# Patient Record
Sex: Male | Born: 1955 | Race: White | Hispanic: No | Marital: Single | Smoking: Former smoker
Health system: Southern US, Community
[De-identification: ages and names within clinical notes are randomized; demographics above are authoritative.]

## PROBLEM LIST (undated history)

## (undated) DIAGNOSIS — E785 Hyperlipidemia, unspecified: Secondary | ICD-10-CM

## (undated) DIAGNOSIS — G4730 Sleep apnea, unspecified: Secondary | ICD-10-CM

## (undated) DIAGNOSIS — F32A Depression, unspecified: Secondary | ICD-10-CM

## (undated) DIAGNOSIS — M1990 Unspecified osteoarthritis, unspecified site: Secondary | ICD-10-CM

## (undated) DIAGNOSIS — N189 Chronic kidney disease, unspecified: Secondary | ICD-10-CM

## (undated) DIAGNOSIS — G709 Myoneural disorder, unspecified: Secondary | ICD-10-CM

## (undated) DIAGNOSIS — F329 Major depressive disorder, single episode, unspecified: Secondary | ICD-10-CM

## (undated) DIAGNOSIS — T7840XA Allergy, unspecified, initial encounter: Secondary | ICD-10-CM

## (undated) DIAGNOSIS — K219 Gastro-esophageal reflux disease without esophagitis: Secondary | ICD-10-CM

## (undated) DIAGNOSIS — F419 Anxiety disorder, unspecified: Secondary | ICD-10-CM

## (undated) DIAGNOSIS — J449 Chronic obstructive pulmonary disease, unspecified: Secondary | ICD-10-CM

## (undated) DIAGNOSIS — I10 Essential (primary) hypertension: Secondary | ICD-10-CM

## (undated) DIAGNOSIS — Z5189 Encounter for other specified aftercare: Secondary | ICD-10-CM

## (undated) HISTORY — DX: Gastro-esophageal reflux disease without esophagitis: K21.9

## (undated) HISTORY — DX: Essential (primary) hypertension: I10

## (undated) HISTORY — PX: COLONOSCOPY: SHX174

## (undated) HISTORY — DX: Depression, unspecified: F32.A

## (undated) HISTORY — DX: Major depressive disorder, single episode, unspecified: F32.9

## (undated) HISTORY — DX: Unspecified osteoarthritis, unspecified site: M19.90

## (undated) HISTORY — DX: Encounter for other specified aftercare: Z51.89

## (undated) HISTORY — DX: Chronic obstructive pulmonary disease, unspecified: J44.9

## (undated) HISTORY — DX: Chronic kidney disease, unspecified: N18.9

## (undated) HISTORY — PX: VASECTOMY: SHX75

## (undated) HISTORY — DX: Allergy, unspecified, initial encounter: T78.40XA

## (undated) HISTORY — PX: KNEE SURGERY: SHX244

## (undated) HISTORY — DX: Hyperlipidemia, unspecified: E78.5

## (undated) HISTORY — DX: Anxiety disorder, unspecified: F41.9

## (undated) HISTORY — DX: Myoneural disorder, unspecified: G70.9

## (undated) HISTORY — DX: Sleep apnea, unspecified: G47.30

---
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
## 2008-11-16 HISTORY — PX: SHOULDER SURGERY: SHX246

## 2018-01-04 DIAGNOSIS — Z9989 Dependence on other enabling machines and devices: Secondary | ICD-10-CM

## 2018-01-04 DIAGNOSIS — G47419 Narcolepsy without cataplexy: Secondary | ICD-10-CM | POA: Insufficient documentation

## 2018-01-04 DIAGNOSIS — Z87898 Personal history of other specified conditions: Secondary | ICD-10-CM | POA: Insufficient documentation

## 2018-01-04 DIAGNOSIS — Z720 Tobacco use: Secondary | ICD-10-CM | POA: Insufficient documentation

## 2018-01-04 DIAGNOSIS — G609 Hereditary and idiopathic neuropathy, unspecified: Secondary | ICD-10-CM | POA: Insufficient documentation

## 2018-01-04 DIAGNOSIS — G500 Trigeminal neuralgia: Secondary | ICD-10-CM | POA: Insufficient documentation

## 2018-01-04 DIAGNOSIS — I10 Essential (primary) hypertension: Secondary | ICD-10-CM | POA: Insufficient documentation

## 2018-01-04 DIAGNOSIS — K219 Gastro-esophageal reflux disease without esophagitis: Secondary | ICD-10-CM | POA: Insufficient documentation

## 2018-01-04 DIAGNOSIS — J449 Chronic obstructive pulmonary disease, unspecified: Secondary | ICD-10-CM | POA: Insufficient documentation

## 2018-01-04 DIAGNOSIS — G4733 Obstructive sleep apnea (adult) (pediatric): Secondary | ICD-10-CM | POA: Insufficient documentation

## 2018-01-10 DIAGNOSIS — M546 Pain in thoracic spine: Secondary | ICD-10-CM

## 2018-01-10 DIAGNOSIS — Z8619 Personal history of other infectious and parasitic diseases: Secondary | ICD-10-CM | POA: Insufficient documentation

## 2018-01-10 DIAGNOSIS — G8929 Other chronic pain: Secondary | ICD-10-CM | POA: Insufficient documentation

## 2018-01-10 DIAGNOSIS — M48061 Spinal stenosis, lumbar region without neurogenic claudication: Secondary | ICD-10-CM | POA: Insufficient documentation

## 2018-01-20 DIAGNOSIS — G250 Essential tremor: Secondary | ICD-10-CM | POA: Insufficient documentation

## 2018-01-20 DIAGNOSIS — R2689 Other abnormalities of gait and mobility: Secondary | ICD-10-CM | POA: Insufficient documentation

## 2018-01-26 DIAGNOSIS — M542 Cervicalgia: Secondary | ICD-10-CM | POA: Insufficient documentation

## 2018-02-01 DIAGNOSIS — M25522 Pain in left elbow: Secondary | ICD-10-CM | POA: Insufficient documentation

## 2018-02-02 ENCOUNTER — Ambulatory Visit: Payer: Medicare HMO | Attending: Nurse Practitioner | Admitting: Nurse Practitioner

## 2018-02-02 ENCOUNTER — Encounter: Payer: Self-pay | Admitting: Nurse Practitioner

## 2018-02-02 ENCOUNTER — Encounter (INDEPENDENT_AMBULATORY_CARE_PROVIDER_SITE_OTHER): Payer: Self-pay

## 2018-02-02 VITALS — BP 178/119 | HR 71 | Temp 98.5°F | Resp 16 | Ht 74.0 in | Wt 280.0 lb

## 2018-02-02 DIAGNOSIS — M25522 Pain in left elbow: Secondary | ICD-10-CM | POA: Diagnosis not present

## 2018-02-02 DIAGNOSIS — Z79899 Other long term (current) drug therapy: Secondary | ICD-10-CM | POA: Diagnosis not present

## 2018-02-02 DIAGNOSIS — M899 Disorder of bone, unspecified: Secondary | ICD-10-CM | POA: Diagnosis not present

## 2018-02-02 DIAGNOSIS — M25512 Pain in left shoulder: Secondary | ICD-10-CM | POA: Diagnosis not present

## 2018-02-02 DIAGNOSIS — M79602 Pain in left arm: Secondary | ICD-10-CM | POA: Insufficient documentation

## 2018-02-02 DIAGNOSIS — M25562 Pain in left knee: Secondary | ICD-10-CM | POA: Diagnosis not present

## 2018-02-02 DIAGNOSIS — M546 Pain in thoracic spine: Secondary | ICD-10-CM | POA: Insufficient documentation

## 2018-02-02 DIAGNOSIS — M25511 Pain in right shoulder: Secondary | ICD-10-CM | POA: Diagnosis not present

## 2018-02-02 DIAGNOSIS — G894 Chronic pain syndrome: Secondary | ICD-10-CM | POA: Diagnosis not present

## 2018-02-02 DIAGNOSIS — Z9889 Other specified postprocedural states: Secondary | ICD-10-CM | POA: Insufficient documentation

## 2018-02-02 DIAGNOSIS — M5442 Lumbago with sciatica, left side: Secondary | ICD-10-CM | POA: Diagnosis not present

## 2018-02-02 DIAGNOSIS — G8929 Other chronic pain: Secondary | ICD-10-CM | POA: Insufficient documentation

## 2018-02-02 DIAGNOSIS — M542 Cervicalgia: Secondary | ICD-10-CM | POA: Insufficient documentation

## 2018-02-02 DIAGNOSIS — M545 Low back pain, unspecified: Secondary | ICD-10-CM | POA: Insufficient documentation

## 2018-02-02 DIAGNOSIS — M25552 Pain in left hip: Secondary | ICD-10-CM | POA: Diagnosis not present

## 2018-02-02 DIAGNOSIS — M5441 Lumbago with sciatica, right side: Secondary | ICD-10-CM | POA: Insufficient documentation

## 2018-02-02 DIAGNOSIS — M25561 Pain in right knee: Secondary | ICD-10-CM | POA: Diagnosis not present

## 2018-02-02 DIAGNOSIS — F17290 Nicotine dependence, other tobacco product, uncomplicated: Secondary | ICD-10-CM | POA: Diagnosis not present

## 2018-02-02 DIAGNOSIS — Z789 Other specified health status: Secondary | ICD-10-CM

## 2018-02-02 DIAGNOSIS — R1031 Right lower quadrant pain: Secondary | ICD-10-CM | POA: Diagnosis not present

## 2018-02-02 NOTE — Progress Notes (Signed)
Safety precautions to be maintained throughout the outpatient stay will include: orient to surroundings, keep bed in low position, maintain call bell within reach at all times, provide assistance with transfer out of bed and ambulation.   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  States that he has had mutliple concussions which is why he is so unsteady on his feet.

## 2018-02-02 NOTE — Progress Notes (Signed)
Patient's Name: Jesse Cortez  MRN: [id number]  Referring Provider: Anabel Bene, MD  DOB: Sep 20, 1956  PCP: No primary care provider on file.  DOS: 02/02/2018  Note by: Dionisio David NP  Service setting: Ambulatory outpatient  Specialty: Interventional Pain Management  Location: [facility] (AMB) Pain Management Facility    Patient type: New Patient    Primary Reason(s) for Visit: Initial Patient Evaluation CC: Back Pain (mid thoracic and lumbar); Neck Pain; Shoulder Pain (bilateral); and Knee Pain (bialteral)  HPI  Mr. Broyhill is a 62 y.o. year old, male patient, who comes today for an initial evaluation. He has Benign essential tremor; Chronic bilateral thoracic back pain; COPD (chronic obstructive pulmonary disease) with chronic bronchitis ([place]); Essential hypertension; Gastroesophageal reflux disease without esophagitis; H/O brain tumor; H/O sepsis; Idiopathic peripheral neuropathy; Left elbow pain; Loss of balance; Narcolepsy; Neck pain; OSA on CPAP; Spinal stenosis of lumbar region; Tobacco user; Trigeminal neuralgia; Chronic thoracic spine pain (Primary Area of Pain); Chronic neck pain (Secondary Area of Pain) (R>L); Chronic pain of left upper extremity; Chronic elbow pain, left [facility] Area of Pain); Chronic pain of both shoulders (Fourth Area of Pain) (L>R); Chronic pain of both knees (L>R); Chronic hip pain, left; Chronic groin pain, right; Chronic bilateral low back pain with bilateral sciatica (L>R); Chronic pain syndrome; Pharmacologic therapy; Disorder of skeletal system; and Problems influencing health status on their problem list.. His primarily concern today is the Back Pain (mid thoracic and lumbar); Neck Pain; Shoulder Pain (bilateral); and Knee Pain (bialteral)  Pain Assessment: Location: Mid(see visit information) Back(see visit information for pain sites ) Radiating:   Onset:   Duration: Chronic pain Quality:   Severity: 8 (back is an 8 and all other pain sites are 5)/10  (self-reported pain score)  Note: Reported level is compatible with observation. Clinically the patient looks like a 2/10 A 2/10 is viewed as "Mild to Moderate" and described as noticeable and distracting. Impossible to hide from other people. More frequent flare-ups. Still possible to adapt and function close to normal. It can be very annoying and may have occasional stronger flare-ups. With discipline, patients may get used to it and adapt. Information on the proper use of the pain scale provided to the patient today. When using our objective Pain Scale, levels between 6 and 10/10 are said to belong in an emergency room, as it progressively worsens from a 6/10, described as severely limiting, requiring emergency care not usually available at an outpatient pain management facility. At a 6/10 level, communication becomes difficult and requires great effort. Assistance to reach the emergency department may be required. Facial flushing and profuse sweating along with potentially dangerous increases in heart rate and blood pressure will be evident. Effect on ADL:   Timing:   Modifying factors:    Onset and Duration: Present longer than 3 months Cause of pain: Work related accident or event Severity: Getting worse, NAS-11 at its worse: 10/10, NAS-11 at its best: 4/10, NAS-11 now: 7/10 and NAS-11 on the average: 7/10 Timing: Not influenced by the time of the day, During activity or exercise and After activity or exercise Aggravating Factors: Bending, Climbing, Intercourse (sex), Kneeling, Lifiting, Motion, Prolonged sitting, Prolonged standing, Squatting, Stooping , Twisting, Walking, Walking uphill, Walking downhill and Working Alleviating Factors: Lying down and Nerve blocks Associated Problems: Depression, Dizziness, Erectile dysfunction, Fatigue, Impotence, Inability to concentrate, Inability to control bladder (urine), Numbness, Personality changes, Sadness, Spasms, Sweating, Tingling, Weakness, Pain  that wakes patient up and Pain that  does not allow patient to sleep Quality of Pain: Aching, Annoying, Cramping, Deep, Disabling, Exhausting, Feeling of constriction, Horrible, Sharp, Shooting, Stabbing, Tender, Tingling and Uncomfortable Previous Examinations or Tests: CT scan, EMG/PNCV, MRI scan, Nerve block, Spinal tap, X-rays, Nerve conduction test and Orthopedic evaluation Previous Treatments: Physical Therapy  The patient comes into the clinics today for the first time for a chronic pain management evaluation. 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 He has had injections by Dr. Redmond Pulling in [place] last 07/2017.he admits the injections were effective and lasted approx 3-4 months. He denies any physical therapy. He did have a recent MRI December 2018 in [place].  His second area of pain is in his neck. He admits that some of the right.he admits that the pain does radiate down into the shoulder up into the head. He admits that he does have trigeminal neuralgia and has been in follow-up by neurology Dr. Melrose Nakayama. He denies any previous surgery, interventional therapy. He has had recent brain MRI, no cervical images.  His third area of pain is in his left elbow. He admits that this is related to a fall. He admits that he has had injections in his elbow in the past however it was not effective He admits that he has been told that he had torn tendon in the elbow.  His fourth area of pain is in his shoulders He admits that the left is greater than the right. He is status post right arthroscopic surgery in the right shoulder which was not effective. He has also had interventional therapy and physical therapy which was not effective.  His fifth area of pain is in his knee. He admits that the left is greater than the right. He has had left knee arthroscopic surgery 2. He  denies any interventional therapy or recent images.  His next area of pain is in his hips. He admits the left is greater than the right. He was told that he had spurs and bursitis to his left hip. He does have pain that radiates to his right inguinal area down to the right thigh. He denies any interventional therapy or physical therapy.  His last area of pain is in his lower back. He denies any previous surgeries, interventional therapy, physical therapy or recent images. He admits that he has been told that he has stenosis in this lumbar spine.  Today I took the time to provide the patient with information regarding this pain practice. The patient was informed that the practice is divided into two sections: an interventional pain management section, as well as a completely separate and distinct medication management section. I explained that there are procedure days for interventional therapies, and evaluation days for follow-ups and medication management. Because of the amount of documentation required during both, they are kept separated. This means that there is the possibility that he may be scheduled for a procedure on one day, and medication management the next. I have also informed him that because of staffing and facility limitations, this practice will no longer take patients for medication management only. To illustrate the reasons for this, I gave the patient the example of surgeons, and how inappropriate it would be to refer a patient to his/her care, just to write for the post-surgical antibiotics on a surgery done by a different surgeon.   Because interventional  pain management is part of the board-certified specialty for the doctors, the patient was informed that joining this practice means that they are open to any and all interventional therapies. I made it clear that this does not mean that they will be forced to have any procedures done. What this means is that I believe interventional  therapies to be essential part of the diagnosis and proper management of chronic pain conditions. Therefore, patients not interested in these interventional alternatives will be better served under the care of a different practitioner.  The patient was also made aware of my Comprehensive Pain Management Safety Guidelines where by joining this practice, they limit all of their nerve blocks and joint injections to those done by our practice, for as long as we are retained to manage their care. Historic Controlled Substance Pharmacotherapy Review  PMP and historical list of controlled substances: Modafinil 200 mg, Lyrica 225 mg,total/acetaminophen 5/325 mg,estosterone 1000 mg,AndroGel 1.62 mg,diazepam 10 mg, hydrocodone/acetaminophen 7.5/325 mg Highest opioid analgesic regimen found: hydrocodone/acetaminophen 7.5/325 mg 4 times daily (last filled 04/15/2016) hydrocodone 30 mg per day Most recent opioid analgesic: hydrocodone/acetaminophen 5/325 mg twice daily (fill date 10/22/2017) hydrocodone 10 mg per day Current opioid analgesics: None Highest recorded MME/day: 30 mg/day MME/day: 0 mg/day Medications: The patient did not bring the medication(s) to the appointment, as requested in our "New Patient Package" Pharmacodynamics: Desired effects: Analgesia: The patient reports >50% benefit. Reported improvement in function: The patient reports medication allows him to accomplish basic ADLs. Clinically meaningful improvement in function (CMIF): Sustained CMIF goals met Perceived effectiveness: Described as relatively effective, allowing for increase in activities of daily living (ADL) Undesirable effects: Side-effects or Adverse reactions: None reported Historical Monitoring: The patient  reports that he does not use drugs. List of all UDS Test(s): No results found for: MDMA, COCAINSCRNUR, PCPSCRNUR, PCPQUANT, CANNABQUANT, THCU, [place] List of all Serum Drug Screening Test(s):  No results found for:  AMPHSCRSER, BARBSCRSER, BENZOSCRSER, COCAINSCRSER, PCPSCRSER, PCPQUANT, THCSCRSER, CANNABQUANT, OPIATESCRSER, OXYSCRSER, PROPOXSCRSER Historical Background Evaluation: [place] PDMP: Six (6) year initial data search conducted.             [place] Department of public safety, offender search: Editor, commissioning Information) Non-contributory Risk Assessment Profile: Aberrant behavior: None observed or detected today Risk factors for fatal opioid overdose: caucasian and male gender Fatal overdose hazard ratio (HR): Calculation deferred Non-fatal overdose hazard ratio (HR): Calculation deferred Risk of opioid abuse or dependence: 0.7-3.0% with doses ? 36 MME/day and 6.1-26% with doses ? 120 MME/day. Substance use disorder (SUD) risk level: Pending results of Medical Psychology Evaluation for SUD Opioid risk tool (ORT) (Total Score): 6  ORT Scoring interpretation table:  Score <3 = Low Risk for SUD  Score between 4-7 = Moderate Risk for SUD  Score >8 = High Risk for Opioid Abuse   PHQ-2 Depression Scale:  Total score: 0  PHQ-2 Scoring interpretation table: (Score and probability of major depressive disorder)  Score 0 = No depression  Score 1 = 15.4% Probability  Score 2 = 21.1% Probability  Score 3 = 38.4% Probability  Score 4 = 45.5% Probability  Score 5 = 56.4% Probability  Score 6 = 78.6% Probability   PHQ-9 Depression Scale:  Total score: 0  PHQ-9 Scoring interpretation table:  Score 0-4 = No depression  Score 5-9 = Mild depression  Score 10-14 = Moderate depression  Score 15-19 = Moderately severe depression  Score 20-27 = Severe depression (2.4 times higher risk of SUD and 2.89 times higher risk  of overuse)   Pharmacologic Plan: Pending ordered tests and/or consults  Meds  The patient has a current medication list which includes the following prescription(s): albuterol, carbamazepine, fluticasone furoate-vilanterol, hydrocodone-acetaminophen, nifedipine, omeprazole, pregabalin, propranolol er,  tamsulosin, umeclidinium bromide, valsartan, and venlafaxine.  Current Outpatient Medications on File Prior to Visit  Medication Sig  . albuterol (PROAIR HFA) 108 (90 Base) MCG/ACT inhaler Inhale 2 puffs into the lungs every 6 (six) hours as needed.  . carbamazepine (TEGRETOL XR) 200 MG 12 hr tablet Take 200 mg by mouth 2 (two) times daily.  . fluticasone furoate-vilanterol (BREO ELLIPTA) 200-25 MCG/INH AEPB Inhale 1 puff into the lungs daily.  Marland Kitchen HYDROcodone-acetaminophen (NORCO/VICODIN) 5-325 MG tablet Take 1 tablet by mouth every 6 (six) hours as needed.  Marland Kitchen NIFEdipine (PROCARDIA-XL/ADALAT CC) 60 MG 24 hr tablet Take 60 mg by mouth daily.  Marland Kitchen omeprazole (PRILOSEC) 20 MG capsule Take 20 mg by mouth daily.  . pregabalin (LYRICA) 225 MG capsule Take 225 mg by mouth 2 (two) times daily.  . propranolol ER (INDERAL LA) 120 MG 24 hr capsule Take 120 mg by mouth 2 (two) times daily.  . tamsulosin (FLOMAX) 0.4 MG CAPS capsule Take 0.4 mg by mouth daily.  Marland Kitchen umeclidinium bromide (INCRUSE ELLIPTA) 62.5 MCG/INH AEPB Inhale 62.5 Inhalers into the lungs daily.  . valsartan (DIOVAN) 160 MG tablet Take 160 mg by mouth daily.  Marland Kitchen venlafaxine (EFFEXOR) 100 MG tablet Take 100 mg by mouth 2 (two) times daily.   No current facility-administered medications on file prior to visit.    Imaging Review  Note: Available results from prior imaging studies were reviewed.        ROS  Cardiovascular History: High blood pressure Pulmonary or Respiratory History: Wheezing and difficulty taking a deep full breath (Asthma), Smoking, Snoring  and Temporary stoppage of breathing during sleep Neurological History: Abnormal skin sensations (Peripheral Neuropathy) Review of Past Neurological Studies: No results found for this or any previous visit. Psychological-Psychiatric History: Anxiousness, Depressed and Prone to panicking Gastrointestinal History: No reported gastrointestinal signs or symptoms such as vomiting or evacuating  blood, reflux, heartburn, alternating episodes of diarrhea and constipation, inflamed or scarred liver, or pancreas or irrregular and/or infrequent bowel movements Genitourinary History: Passing kidney stones Hematological History: No reported hematological signs or symptoms such as prolonged bleeding, low or poor functioning platelets, bruising or bleeding easily, hereditary bleeding problems, low energy levels due to low hemoglobin or being anemic Endocrine History: No reported endocrine signs or symptoms such as high or low blood sugar, rapid heart rate due to high thyroid levels, obesity or weight gain due to slow thyroid or thyroid disease Rheumatologic History: Generalized muscle aches (Fibromyalgia) Musculoskeletal History: Negative for myasthenia gravis, muscular dystrophy, multiple sclerosis or malignant hyperthermia Work History: Disabled  Allergies  Mr. Mitton is allergic to penicillin g and primidone.  Laboratory Chemistry  Inflammation Markers No results found for: CRP, ESRSEDRATE (CRP: Acute Phase) (ESR: Chronic Phase) Renal Function Markers No results found for: BUN, CREATININE, GFRAA, GFRNONAA Hepatic Function Markers No results found for: AST, ALT, ALBUMIN, ALKPHOS, HCVAB Electrolytes No results found for: NA, K, CL, CALCIUM, MG Neuropathy Markers No results found for: [id number] Bone Pathology Markers No results found for: Hendricks Milo, VD125OH2TOT, [id number], [id number], 25OHVITD1, 25OHVITD2, 25OHVITD3, CALCIUM, TESTOFREE, TESTOSTERONE Coagulation Parameters No results found for: INR, LABPROT, APTT, PLT Cardiovascular Markers No results found for: BNP, HGB, HCT Note: Lab results reviewed.  [place]  Drug: Mr. Wenzler  reports that he does not use  drugs. Alcohol:  reports that he does not drink alcohol. Tobacco:  reports that he has been smoking cigars.  he has never used smokeless tobacco. Medical:  has a past medical history of Anxiety, Blood transfusion without  reported diagnosis (1968), Chronic kidney disease, COPD (chronic obstructive pulmonary disease) ([place]), Depression, GERD (gastroesophageal reflux disease), Hyperlipidemia, Hypertension, Neuromuscular disorder ([place]), and Sleep apnea. Family: family history includes COPD in his mother; Dementia in his mother; Diabetes in his father and mother; Heart disease in his mother.  Past Surgical History:  Procedure Laterality Date  . 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 KNEE SURGERY Left 1987, 2013  . SHOULDER SURGERY Right 2010   orthoscopy  . TONSILLECTOMY  1964  . VASECTOMY     Active Ambulatory Problems    Diagnosis Date Noted  . Benign essential tremor 01/20/2018  . Chronic bilateral thoracic back pain 01/10/2018  . COPD (chronic obstructive pulmonary disease) with chronic bronchitis ([place]) 01/04/2018  . Essential hypertension 01/04/2018  . Gastroesophageal reflux disease without esophagitis 01/04/2018  . H/O brain tumor 01/04/2018  . H/O sepsis 01/10/2018  . Idiopathic peripheral neuropathy 01/04/2018  . Left elbow pain 02/01/2018  . Loss of balance 01/20/2018  . Narcolepsy 01/04/2018  . Neck pain 01/26/2018  . OSA on CPAP 01/04/2018  . Spinal stenosis of lumbar region 01/10/2018  . Tobacco user 01/04/2018  . Trigeminal neuralgia 01/04/2018  . Chronic thoracic spine pain (Primary Area of Pain) 02/02/2018  . Chronic neck pain (Secondary Area of Pain) (R>L) 02/02/2018  . Chronic pain of left upper extremity 02/02/2018  . Chronic elbow pain, left [facility] Area of Pain) 02/02/2018  . Chronic pain of both shoulders (Fourth Area of Pain) (L>R) 02/02/2018  . Chronic pain of both knees (L>R) 02/02/2018  . Chronic hip pain, left 02/02/2018  . Chronic groin pain, right 02/02/2018  . Chronic bilateral low back pain with bilateral sciatica (L>R) 02/02/2018  . Chronic pain syndrome 02/02/2018  . Pharmacologic therapy 02/02/2018  . Disorder of skeletal system 02/02/2018  . Problems  influencing health status 02/02/2018   Resolved Ambulatory Problems    Diagnosis Date Noted  . No Resolved Ambulatory Problems   Past Medical History:  Diagnosis Date  . Anxiety   . Blood transfusion without reported diagnosis 1968  . Chronic kidney disease   . COPD (chronic obstructive pulmonary disease) ([place])   . Depression   . GERD (gastroesophageal reflux disease)   . Hyperlipidemia   . Hypertension   . Neuromuscular disorder ([place])   . Sleep apnea    Constitutional Exam  General appearance: Well nourished, well developed, and well hydrated. In no apparent acute distress Vitals:   02/02/18 1308  BP: (!) 178/119  Pulse: 71  Resp: 16  Temp: 98.5 F (36.9 C)  TempSrc: Oral  SpO2: 99%  Weight: 280 lb (127 kg)  Height: '6\' 2"'  (1.88 m)   BMI Assessment: Estimated body mass index is 35.95 kg/m as calculated from the following:   Height as of this encounter: '6\' 2"'  (1.88 m).   Weight as of this encounter: 280 lb (127 kg).  BMI interpretation table: BMI level Category Range association with higher incidence of chronic pain  <18 kg/m2 Underweight   18.5-24.9 kg/m2 Ideal body weight   25-29.9 kg/m2 Overweight Increased incidence by 20%  30-34.9 kg/m2 Obese (Class I) Increased incidence by 68%  35-39.9 kg/m2 Severe obesity (Class II) Increased incidence by 136%  >40 kg/m2 Extreme obesity (Class  III) Increased incidence by 254%   BMI Readings from Last 4 Encounters:  02/02/18 35.95 kg/m   Wt Readings from Last 4 Encounters:  02/02/18 280 lb (127 kg)  Psych/Mental status: Alert, oriented x 3 (person, place, & time)       Eyes: PERLA Respiratory: No evidence of acute respiratory distress  Cervical Spine Exam  Inspection: No masses, redness, or swelling Alignment: Symmetrical Functional ROM: Unrestricted ROM      Stability: No instability detected Muscle strength & Tone: Functionally intact Sensory: Unimpaired Palpation: Complains of area being tender to palpation               Upper Extremity (UE) Exam    Side: Right upper extremity  Side: Left upper extremity  Inspection: No masses, redness, swelling, or asymmetry. No contractures  Inspection: No masses, redness, swelling, or asymmetry. No contractures  Functional ROM: Adequate ROM          Functional ROM: Adequate ROM          Muscle strength & Tone: Functionally intact  Muscle strength & Tone: Functionally intact  Sensory: Unimpaired  Sensory: Unimpaired  Palpation: No palpable anomalies              Palpation: No palpable anomalies              Specialized Test(s): Deferred         Specialized Test(s): Deferred          Thoracic Spine Exam  Inspection: No masses, redness, or swelling Alignment: Symmetrical Functional ROM: Unrestricted ROM Stability: No instability detected Sensory: Unimpaired Muscle strength & Tone: Complains of area being tender to palpation  Lumbar Spine Exam  Inspection: No masses, redness, or swelling Alignment: Symmetrical Functional ROM: Unrestricted ROM      Stability: No instability detected Muscle strength & Tone: Functionally intact Sensory: Unimpaired Palpation: No palpable anomalies       Provocative Tests: Lumbar Hyperextension and rotation test: Unable to perform       Patrick's Maneuver: Positive for left-sided S-I arthralgia and for left hip arthralgia  Gait & Posture Assessment  Ambulation: Patient ambulates using a cane Gait: Awkward Posture: WNL   Lower Extremity Exam    Side: Right lower extremity  Side: Left lower extremity  Inspection: No masses, redness, swelling, or asymmetry. No contractures  Inspection: No masses, redness, swelling, or asymmetry. No contractures  Functional ROM: Adequate ROM          Functional ROM: Painful ROM for hip joint  Muscle strength & Tone: Functionally intact  Muscle strength & Tone: Functionally intact  Sensory: Unimpaired  Sensory: Unimpaired  Palpation: Complains of area being tender to palpation  Palpation:  Complains of area being tender to palpation   Assessment  Primary Diagnosis & Pertinent Problem List: The primary encounter diagnosis was Chronic thoracic spine pain. Diagnoses of Chronic neck pain (Secondary Area of Pain) (R>L), Chronic pain of left upper extremity, Chronic elbow pain, left (Tertiary Area of Pain), Chronic pain of both shoulders (Fourth Area of Pain) (L>R), Chronic pain of both knees (L>R), Chronic hip pain, left, Chronic groin pain, right, Chronic bilateral low back pain with bilateral sciatica (L>R), Chronic pain syndrome, Pharmacologic therapy, Disorder of skeletal system, and Problems influencing health status were also pertinent to this visit.  Visit Diagnosis: 1. Chronic thoracic spine pain   2. Chronic neck pain (Secondary Area of Pain) (R>L)   3. Chronic pain of left upper extremity   4. Chronic elbow  pain, left [facility] Area of Pain)   5. Chronic pain of both shoulders (Fourth Area of Pain) (L>R)   6. Chronic pain of both knees (L>R)   7. Chronic hip pain, left   8. Chronic groin pain, right   9. Chronic bilateral low back pain with bilateral sciatica (L>R)   10. Chronic pain syndrome   11. Pharmacologic therapy   12. Disorder of skeletal system   13. Problems influencing health status    Plan of Care  Initial treatment plan:  Please be advised that as per protocol, today's visit has been an evaluation only. We have not taken over the patient's controlled substance management.  Problem-specific plan: No problem-specific Assessment & Plan notes found for this encounter.  Ordered Lab-work, Procedure(s), Referral(s), & Consult(s): Orders Placed This Encounter  Procedures  . DG Cervical Spine With Flex & Extend  . DG Lumbar Spine Complete W/Bend  . DG ELBOW COMPLETE LEFT (3+VIEW)  . DG Shoulder Right  . DG Shoulder Left  . DG Knee 1-2 Views Left  . DG Knee 1-2 Views Right  . DG HIP UNILAT W OR W/O PELVIS 2-3 VIEWS LEFT  . DG Si Joints  . Compliance Drug  Analysis, Ur  . Comp. Metabolic Panel (12)  . Magnesium  . Vitamin B12  . Sedimentation rate  . 25-Hydroxyvitamin D Lcms D2+D3  . C-reactive protein  . Ambulatory referral to Psychology   Pharmacotherapy: Medications ordered:  No orders of the defined types were placed in this encounter.  Medications administered during this visit: Kewan Mcnease had no medications administered during this visit.   Pharmacotherapy under consideration:  Opioid Analgesics: The patient was informed that there is no guarantee that he would be a candidate for opioid analgesics. The decision will be made following CDC guidelines. This decision will be based on the results of diagnostic studies, as well as Mr. Springborn risk profile.  Membrane stabilizer: To be determined at a later time Muscle relaxant: To be determined at a later time NSAID: To be determined at a later time Other analgesic(s): To be determined at a later time   Interventional therapies under consideration: Mr. Seres was informed that there is no guarantee that he would be a candidate for interventional therapies. The decision will be based on the results of diagnostic studies, as well as Mr. Soberano risk profile.  Possible procedure(s): Diagnostic midline thoracic facet nerve block possible midline thoracic facet RFA Diagnostic right cervical facet nerve block Possible right cervical facet RFA Diagnostic occipital nerve block diagnostic bilateral shoulder intraarticular joint injection Diagnostic bilateral intra-articular knee injections Hyalgan series Diagnostic bilateral genicular nerve block Possible bilateral genicular nerve RFA Diagnostic left hip intra-articular joint injection Diagnostic bilateral lumbar transforaminal ESI diagnostic bilateral lumbar facet nerve block Possible bilateral lumbar facet RFA   Provider-requested follow-up: Return for 2nd Visit, w/ Dr. Dossie Arbour, after [facility] eval.  No future  appointments.  Primary Care Physician: No primary care provider on file. Location: [facility] Note by:  Date: 02/02/2018; Time: 3:23 PM  Pain Score Disclaimer: We use the NRS-11 scale. This is a self-reported, subjective measurement of pain severity with only modest accuracy. It is used primarily to identify changes within a particular patient. It must be understood that outpatient pain scales are significantly less accurate that those used for research, where they can be applied under ideal controlled circumstances with minimal exposure to variables. In reality, the score is likely to be a combination of pain intensity  and pain affect, where pain affect describes the degree of emotional arousal or changes in action readiness caused by the sensory experience of pain. Factors such as social and work situation, setting, emotional state, anxiety levels, expectation, and prior pain experience may influence pain perception and show large inter-individual differences that may also be affected by time variables.  Patient instructions provided during this appointment: Patient Instructions   ____________________________________________________________________________________________  Appointment Policy Summary  It is our goal and responsibility to provide the medical community with assistance in the evaluation and management of patients with chronic pain. Unfortunately our resources are limited. Because we do not have an unlimited amount of time, or available appointments, we are required to closely monitor and manage their use. The following rules exist to maximize their use:  Patient's responsibilities: 1. Punctuality:  At what time should I arrive? You should be physically present in our office 30 minutes before your scheduled appointment. Your scheduled appointment is with your assigned healthcare provider. However, it takes 5-10 minutes to be "checked-in", and another 15  minutes for the nurses to do the admission. If you arrive to our office at the time you were given for your appointment, you will end up being at least 20-25 minutes late to your appointment with the provider. 2. Tardiness:  What happens if I arrive only a few minutes after my scheduled appointment time? You will need to reschedule your appointment. The cutoff is your appointment time. This is why it is so important that you arrive at least 30 minutes before that appointment. If you have an appointment scheduled for 10:00 AM and you arrive at 10:01, you will be required to reschedule your appointment.  3. Plan ahead:  Always assume that you will encounter traffic on your way in. Plan for it. If you are dependent on a driver, make sure they understand these rules and the need to arrive early. 4. Other appointments and responsibilities:  Avoid scheduling any other appointments before or after your pain clinic appointments.  5. Be prepared:  Write down everything that you need to discuss with your healthcare provider and give this information to the admitting nurse. Write down the medications that you will need refilled. Bring your pills and bottles (even the empty ones), to all of your appointments, except for those where a procedure is scheduled. 6. No children or pets:  Find someone to take care of them. It is not appropriate to bring them in. 7. Scheduling changes:  We request "advanced notification" of any changes or cancellations. 8. Advanced notification:  Defined as a time period of more than 24 hours prior to the originally scheduled appointment. This allows for the appointment to be offered to other patients. 9. Rescheduling:  When a visit is rescheduled, it will require the cancellation of the original appointment. For this reason they both fall within the category of "Cancellations".  10. Cancellations:  They require advanced notification. Any cancellation less than 24 hours before the   appointment will be recorded as a "No Show". 11. No Show:  Defined as an unkept appointment where the patient failed to notify or declare to the practice their intention or inability to keep the appointment.  Corrective process for repeat offenders:  1. Tardiness: Three (3) episodes of rescheduling due to late arrivals will be recorded as one (1) "No Show". 2. Cancellation or reschedule: Three (3) cancellations or rescheduling will be recorded as one (1) "No Show". 3. "No Shows": Three (3) "No Shows" within a  12 month period will result in discharge from the practice. ____________________________________________________________________________________________  ____________________________________________________________________________________________  Pain Scale  Introduction: The pain score used by this practice is the Verbal Numerical Rating Scale (VNRS-11). This is an 11-point scale. It is for adults and children 10 years or older. There are significant differences in how the pain score is reported, used, and applied. Forget everything you learned in the past and learn this scoring system.  General Information: The scale should reflect your current level of pain. Unless you are specifically asked for the level of your worst pain, or your average pain. If you are asked for one of these two, then it should be understood that it is over the past 24 hours.  Basic Activities of Daily Living (ADL): Personal hygiene, dressing, eating, transferring, and using restroom.  Instructions: Most patients tend to report their level of pain as a combination of two factors, their physical pain and their psychosocial pain. This last one is also known as "suffering" and it is reflection of how physical pain affects you socially and psychologically. From now on, report them separately. From this point on, when asked to report your pain level, report only your physical pain. Use the following table for  reference.  Pain Clinic Pain Levels (0-5/10)  Pain Level Score  Description  No Pain 0   Mild pain 1 Nagging, annoying, but does not interfere with basic activities of daily living (ADL). Patients are able to eat, bathe, get dressed, toileting (being able to get on and off the toilet and perform personal hygiene functions), transfer (move in and out of bed or a chair without assistance), and maintain continence (able to control bladder and bowel functions). Blood pressure and heart rate are unaffected. A normal heart rate for a healthy adult ranges from 60 to 100 bpm (beats per minute).   Mild to moderate pain 2 Noticeable and distracting. Impossible to hide from other people. More frequent flare-ups. Still possible to adapt and function close to normal. It can be very annoying and may have occasional stronger flare-ups. With discipline, patients may get used to it and adapt.   Moderate pain 3 Interferes significantly with activities of daily living (ADL). It becomes difficult to feed, bathe, get dressed, get on and off the toilet or to perform personal hygiene functions. Difficult to get in and out of bed or a chair without assistance. Very distracting. With effort, it can be ignored when deeply involved in activities.   Moderately severe pain 4 Impossible to ignore for more than a few minutes. With effort, patients may still be able to manage work or participate in some social activities. Very difficult to concentrate. Signs of autonomic nervous system discharge are evident: dilated pupils (mydriasis); mild sweating (diaphoresis); sleep interference. Heart rate becomes elevated (>115 bpm). Diastolic blood pressure (lower number) rises above 100 mmHg. Patients find relief in laying down and not moving.   Severe pain 5 Intense and extremely unpleasant. Associated with frowning face and frequent crying. Pain overwhelms the senses.  Ability to do any activity or maintain social relationships becomes  significantly limited. Conversation becomes difficult. Pacing back and forth is common, as getting into a comfortable position is nearly impossible. Pain wakes you up from deep sleep. Physical signs will be obvious: pupillary dilation; increased sweating; goosebumps; brisk reflexes; cold, clammy hands and feet; nausea, vomiting or dry heaves; loss of appetite; significant sleep disturbance with inability to fall asleep or to remain asleep. When persistent, significant weight loss is  observed due to the complete loss of appetite and sleep deprivation.  Blood pressure and heart rate becomes significantly elevated. Caution: If elevated blood pressure triggers a pounding headache associated with blurred vision, then the patient should immediately seek attention at an urgent or emergency care unit, as these may be signs of an impending stroke.    Emergency Department Pain Levels (6-10/10)  Emergency Room Pain 6 Severely limiting. Requires emergency care and should not be seen or managed at an outpatient pain management facility. Communication becomes difficult and requires great effort. Assistance to reach the emergency department may be required. Facial flushing and profuse sweating along with potentially dangerous increases in heart rate and blood pressure will be evident.   Distressing pain 7 Self-care is very difficult. Assistance is required to transport, or use restroom. Assistance to reach the emergency department will be required. Tasks requiring coordination, such as bathing and getting dressed become very difficult.   Disabling pain 8 Self-care is no longer possible. At this level, pain is disabling. The individual is unable to do even the most "basic" activities such as walking, eating, bathing, dressing, transferring to a bed, or toileting. Fine motor skills are lost. It is difficult to think clearly.   Incapacitating pain 9 Pain becomes incapacitating. Thought processing is no longer possible.  Difficult to remember your own name. Control of movement and coordination are lost.   The worst pain imaginable 10 At this level, most patients pass out from pain. When this level is reached, collapse of the autonomic nervous system occurs, leading to a sudden drop in blood pressure and heart rate. This in turn results in a temporary and dramatic drop in blood flow to the brain, leading to a loss of consciousness. Fainting is one of the body's self defense mechanisms. Passing out puts the brain in a calmed state and causes it to shut down for a while, in order to begin the healing process.    Summary: 1. Refer to this scale when providing [place] with your pain level. 2. Be accurate and careful when reporting your pain level. This will help with your care. 3. Over-reporting your pain level will lead to loss of credibility. 4. Even a level of 1/10 means that there is pain and will be treated at our facility. 5. High, inaccurate reporting will be documented as "Symptom Exaggeration", leading to loss of credibility and suspicions of possible secondary gains such as obtaining more narcotics, or wanting to appear disabled, for fraudulent reasons. 6. Only pain levels of 5 or below will be seen at our facility. 7. Pain levels of 6 and above will be sent to the Emergency Department and the appointment cancelled. ____________________________________________________________________________________________

## 2018-02-02 NOTE — Patient Instructions (Signed)

## 2018-02-07 DIAGNOSIS — G5623 Lesion of ulnar nerve, bilateral upper limbs: Secondary | ICD-10-CM | POA: Insufficient documentation

## 2018-02-07 DIAGNOSIS — M75112 Incomplete rotator cuff tear or rupture of left shoulder, not specified as traumatic: Secondary | ICD-10-CM | POA: Insufficient documentation

## 2018-02-07 DIAGNOSIS — N281 Cyst of kidney, acquired: Secondary | ICD-10-CM | POA: Insufficient documentation

## 2018-02-07 DIAGNOSIS — Z9889 Other specified postprocedural states: Secondary | ICD-10-CM | POA: Insufficient documentation

## 2018-02-07 DIAGNOSIS — K7689 Other specified diseases of liver: Secondary | ICD-10-CM | POA: Insufficient documentation

## 2018-02-07 LAB — COMPLIANCE DRUG ANALYSIS, UR

## 2018-02-09 LAB — 25-HYDROXY VITAMIN D LCMS D2+D3: 25-Hydroxy, Vitamin D: 19 ng/mL — ABNORMAL LOW

## 2018-02-09 LAB — COMP. METABOLIC PANEL (12)
ALBUMIN: 4.6 g/dL (ref 3.5–5.5)
ALK PHOS: 105 IU/L (ref 39–117)
AST: 12 IU/L (ref 0–40)
Albumin/Globulin Ratio: 2.2 (ref 1.2–2.2)
BUN/Creatinine Ratio: 15 (ref 9–20)
BUN: 12 mg/dL (ref 6–24)
Bilirubin Total: <0.2 mg/dL (ref 0.0–1.2)
CREATININE: 0.79 mg/dL (ref 0.76–1.27)
Calcium: 8.9 mg/dL (ref 8.7–10.2)
Chloride: 101 mmol/L (ref 96–106)
GFR calc Af Amer: 114 mL/min/{1.73_m2} (ref >59)
GFR, EST NON AFRICAN AMERICAN: 98 mL/min/{1.73_m2} (ref >59)
GLUCOSE: 91 mg/dL (ref 65–99)
Globulin, Total: 2.1 g/dL (ref 1.5–4.5)
POTASSIUM: 4.3 mmol/L (ref 3.5–5.2)
SODIUM: 137 mmol/L (ref 134–144)
Total Protein: 6.7 g/dL (ref 6.0–8.5)

## 2018-02-09 LAB — VITAMIN B12: VITAMIN B 12: 272 pg/mL (ref 232–1245)

## 2018-02-09 LAB — SEDIMENTATION RATE: Sed Rate: 26 mm/hr (ref 0–30)

## 2018-02-09 LAB — MAGNESIUM: MAGNESIUM: 2.2 mg/dL (ref 1.6–2.3)

## 2018-02-09 LAB — C-REACTIVE PROTEIN: CRP: 14.3 mg/L — ABNORMAL HIGH (ref 0.0–4.9)

## 2018-02-09 LAB — 25-HYDROXYVITAMIN D LCMS D2+D3
25-HYDROXY, VITAMIN D-2: 13 ng/mL
25-HYDROXY, VITAMIN D-3: 6.2 ng/mL

## 2018-03-17 DIAGNOSIS — S46212A Strain of muscle, fascia and tendon of other parts of biceps, left arm, initial encounter: Secondary | ICD-10-CM | POA: Insufficient documentation

## 2018-04-19 DIAGNOSIS — N200 Calculus of kidney: Secondary | ICD-10-CM | POA: Insufficient documentation

## 2018-04-27 DIAGNOSIS — M25541 Pain in joints of right hand: Secondary | ICD-10-CM | POA: Insufficient documentation

## 2018-07-01 DIAGNOSIS — R251 Tremor, unspecified: Secondary | ICD-10-CM | POA: Insufficient documentation

## 2018-10-20 DIAGNOSIS — F4310 Post-traumatic stress disorder, unspecified: Secondary | ICD-10-CM | POA: Insufficient documentation

## 2018-12-30 DIAGNOSIS — Z87891 Personal history of nicotine dependence: Secondary | ICD-10-CM | POA: Insufficient documentation

## 2019-09-06 DIAGNOSIS — R7989 Other specified abnormal findings of blood chemistry: Secondary | ICD-10-CM | POA: Insufficient documentation

## 2019-09-06 DIAGNOSIS — E785 Hyperlipidemia, unspecified: Secondary | ICD-10-CM | POA: Insufficient documentation

## 2019-09-06 DIAGNOSIS — K635 Polyp of colon: Secondary | ICD-10-CM | POA: Insufficient documentation

## 2019-10-15 DIAGNOSIS — H538 Other visual disturbances: Secondary | ICD-10-CM | POA: Insufficient documentation

## 2019-12-26 ENCOUNTER — Other Ambulatory Visit: Payer: Self-pay | Admitting: Internal Medicine

## 2019-12-26 DIAGNOSIS — R1011 Right upper quadrant pain: Secondary | ICD-10-CM

## 2019-12-29 ENCOUNTER — Ambulatory Visit
Admission: RE | Admit: 2019-12-29 | Discharge: 2019-12-29 | Disposition: A | Discharge status: Home / Self Care | Payer: Medicare HMO | Source: Ambulatory Visit | Attending: Internal Medicine | Admitting: Internal Medicine

## 2019-12-29 ENCOUNTER — Other Ambulatory Visit: Payer: Self-pay

## 2019-12-29 DIAGNOSIS — R1011 Right upper quadrant pain: Secondary | ICD-10-CM | POA: Insufficient documentation

## 2019-12-29 IMAGING — US US ABDOMEN LIMITED
1 series · 14 of 25 positions shown · non-contrast
Comparison: None.

CLINICAL DATA: 63-year-old male with RIGHT UPPER quadrant abdominal
pain

EXAM:
ULTRASOUND ABDOMEN LIMITED RIGHT UPPER QUADRANT

[Series 1: us abdomen limited · 0.28mm/px · 14 of 39 slices shown]
[im 1/39]
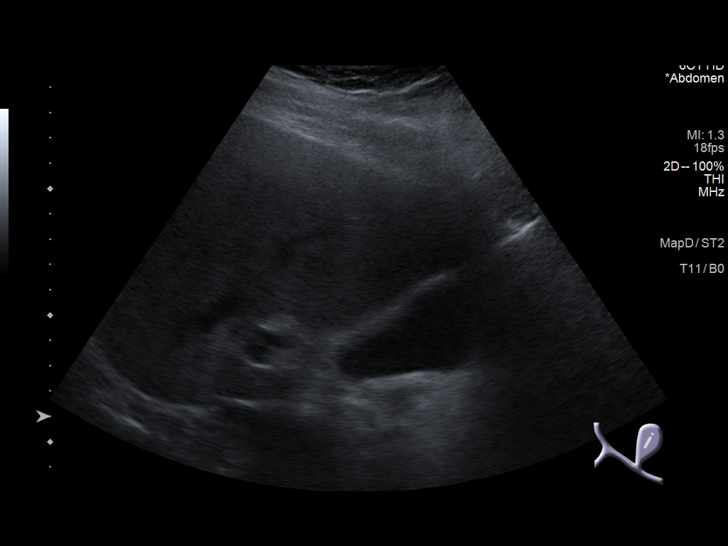
[im 4/39]
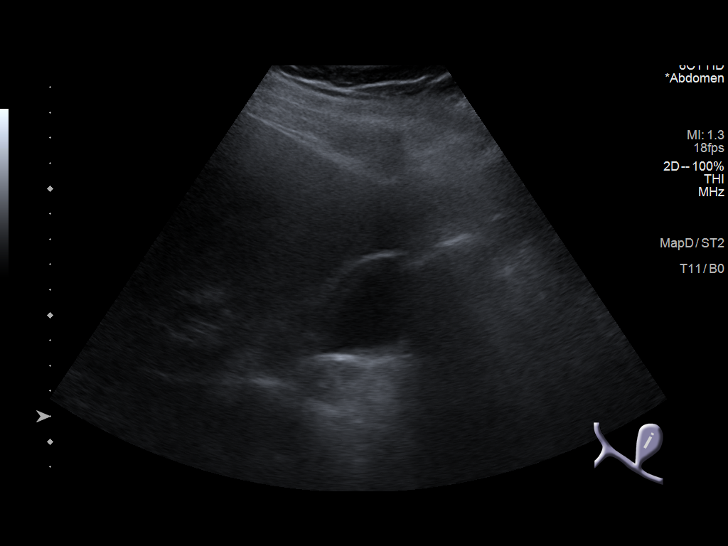
[im 7/39]
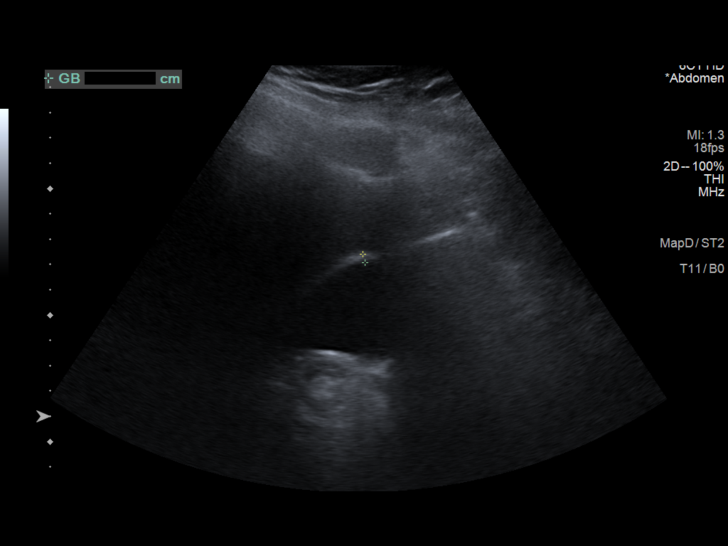
[im 10/39]
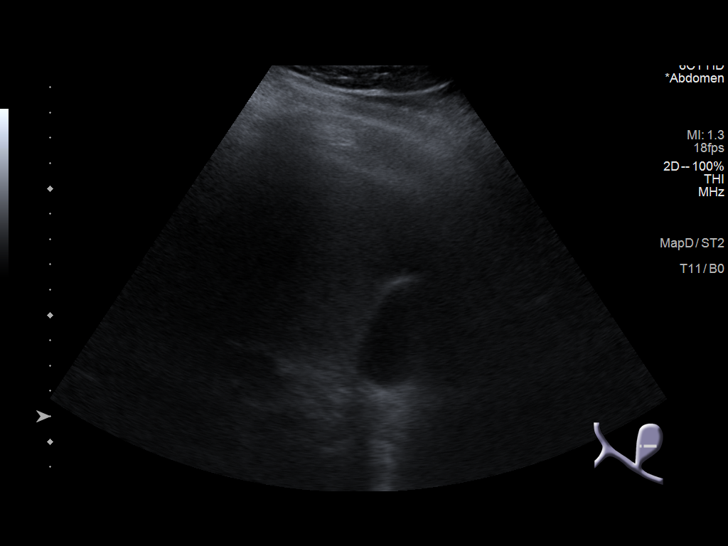
[im 13/39]
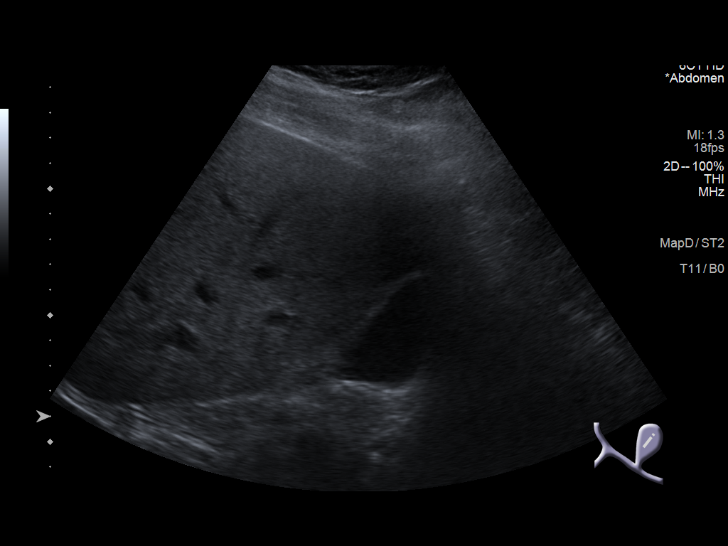
[im 15/39]
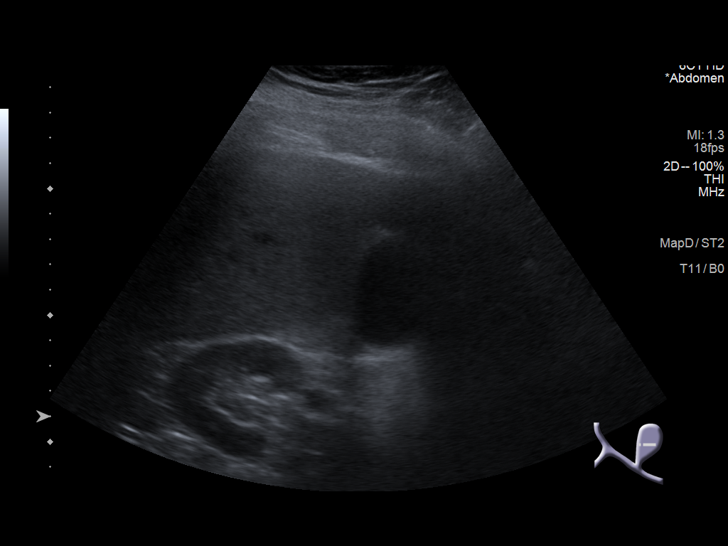
[im 18/39]
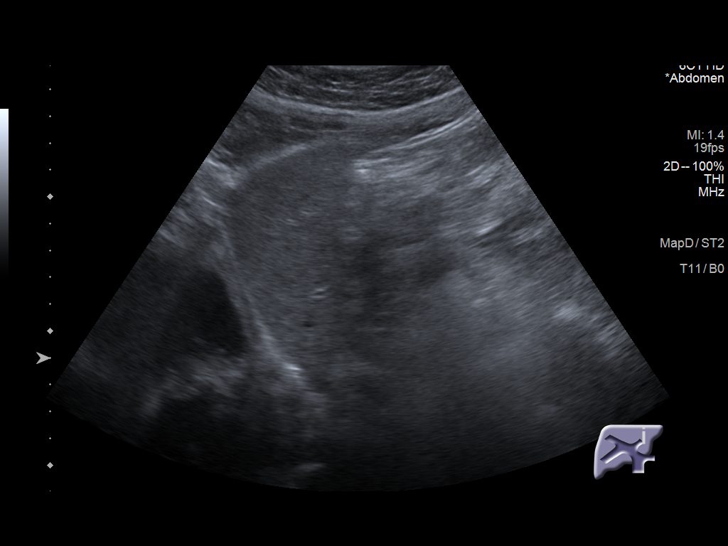
[im 21/39]
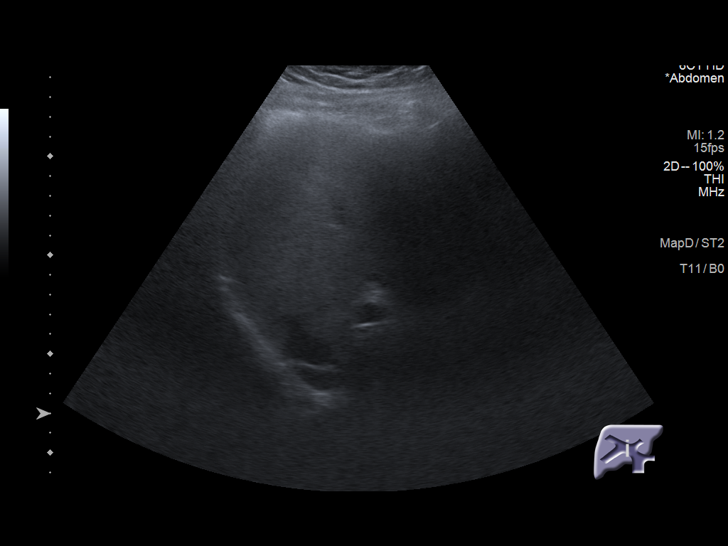
[im 24/39]
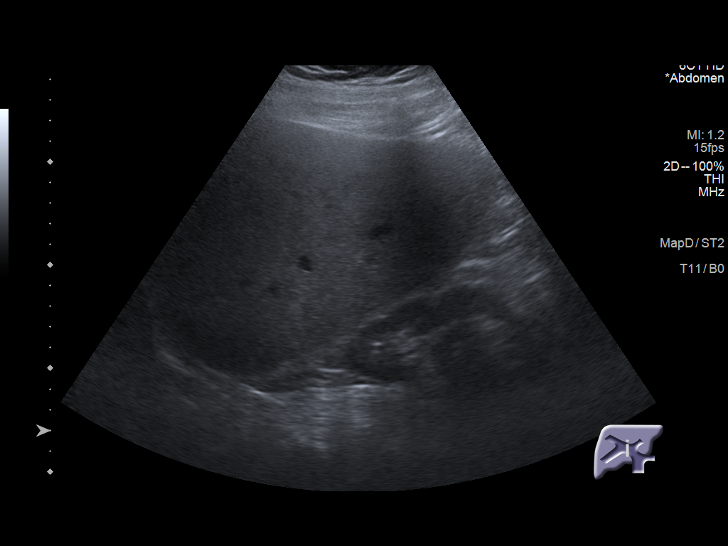
[im 26/39]
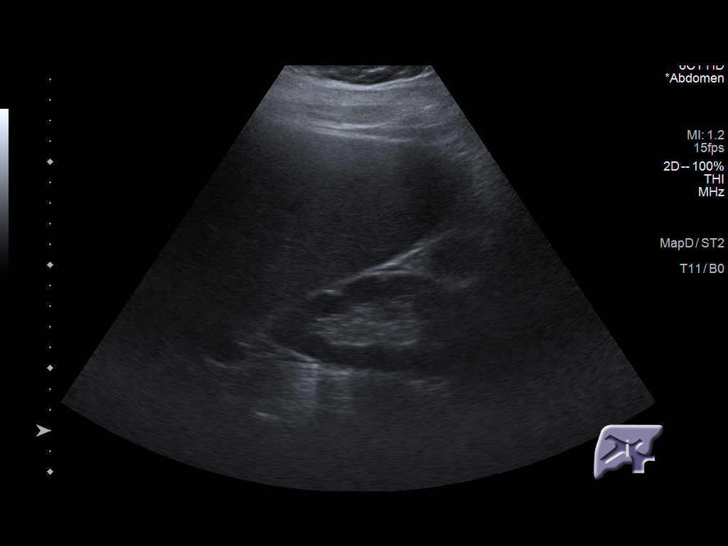
[im 29/39]
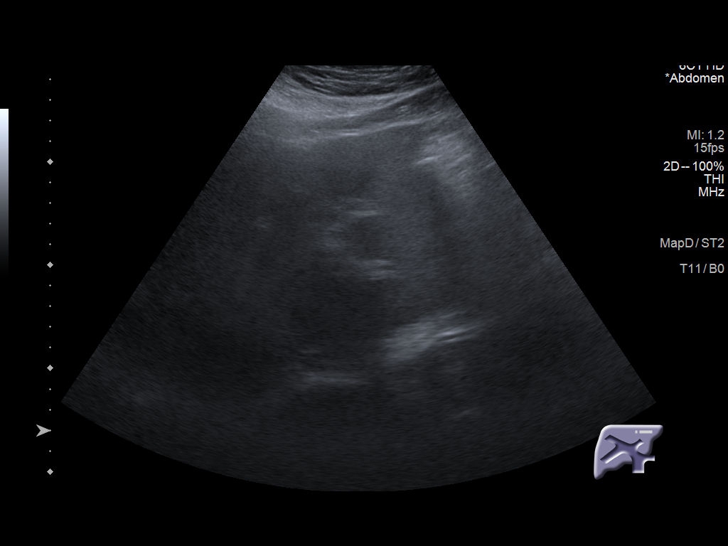
[im 32/39]
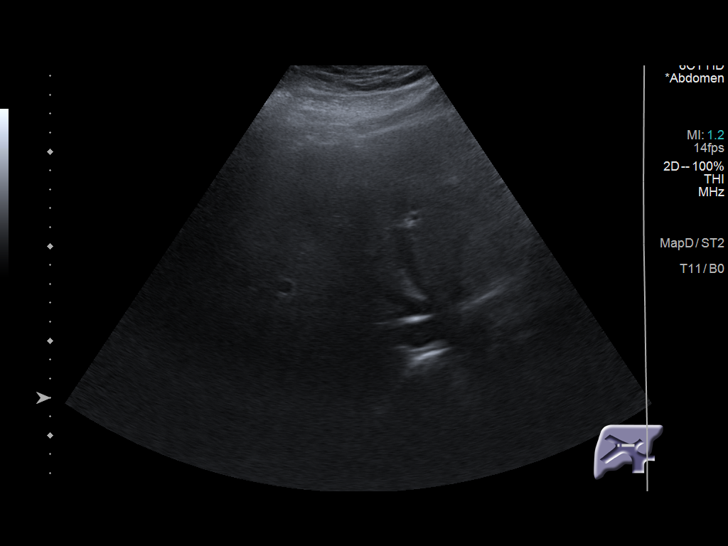
[im 35/39]
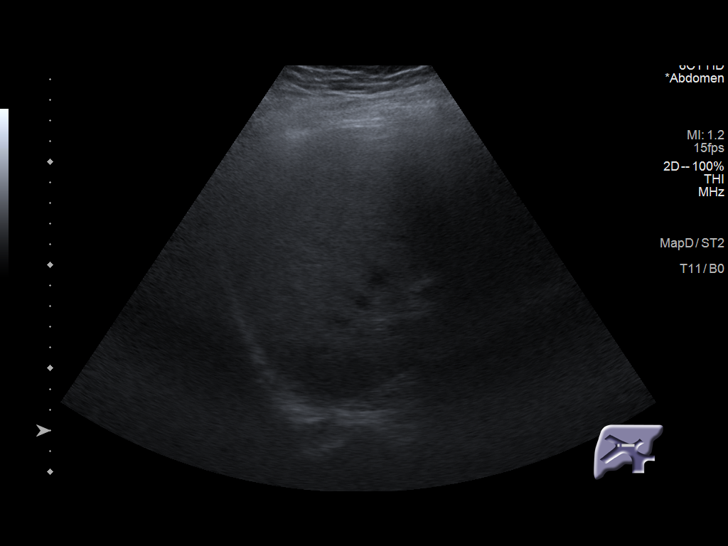
[im 39/39]
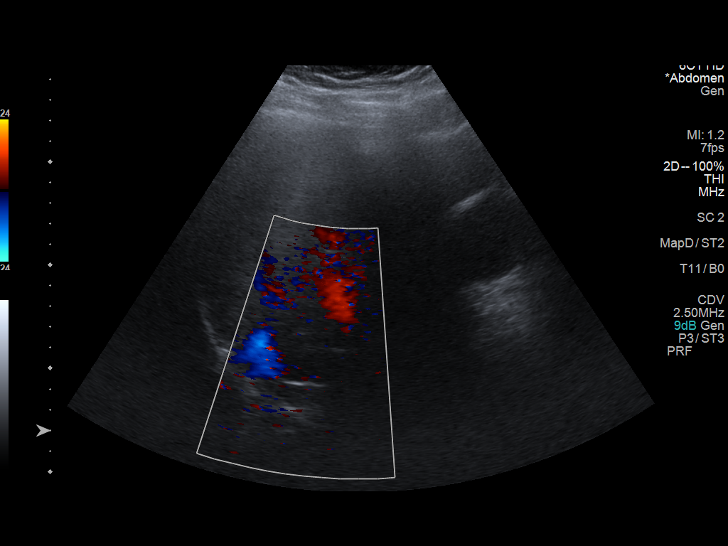

[14 of 25 positions shown; findings below may reference images not displayed]

FINDINGS: Gallbladder:

UPPER limits normal gallbladder wall thickness noted (3 mm). There
is no evidence of cholelithiasis, sonographic Murphy sign or
pericholecystic fluid.

Common bile duct:

Diameter: 4 mm. No intrahepatic or extrahepatic biliary dilatation
identified.

Liver:

Diffusely increased hepatic echogenicity noted without focal hepatic
abnormality. Portal vein is patent on color Doppler imaging with
normal direction of blood flow towards the liver.

Other: None.
IMPRESSION: 1. Hepatic steatosis without focal hepatic abnormality.
2. UPPER limits normal gallbladder wall thickness without
cholelithiasis or other evidence of acute cholecystitis. If there is
strong clinical suspicion for acute cholecystitis, consider nuclear
medicine study.

## 2020-08-20 ENCOUNTER — Ambulatory Visit: Payer: Medicare HMO | Admitting: Physician Assistant

## 2020-08-20 ENCOUNTER — Other Ambulatory Visit: Payer: Self-pay

## 2020-08-20 ENCOUNTER — Encounter: Payer: Self-pay | Admitting: Physician Assistant

## 2020-08-20 ENCOUNTER — Ambulatory Visit (INDEPENDENT_AMBULATORY_CARE_PROVIDER_SITE_OTHER): Payer: Medicare HMO | Admitting: Physician Assistant

## 2020-08-20 VITALS — BP 130/72 | HR 56 | Temp 98.0°F | Ht 74.0 in | Wt 297.0 lb

## 2020-08-20 DIAGNOSIS — M546 Pain in thoracic spine: Secondary | ICD-10-CM

## 2020-08-20 DIAGNOSIS — I10 Essential (primary) hypertension: Secondary | ICD-10-CM

## 2020-08-20 DIAGNOSIS — R040 Epistaxis: Secondary | ICD-10-CM

## 2020-08-20 DIAGNOSIS — Z1211 Encounter for screening for malignant neoplasm of colon: Secondary | ICD-10-CM

## 2020-08-20 DIAGNOSIS — G8929 Other chronic pain: Secondary | ICD-10-CM

## 2020-08-20 DIAGNOSIS — J3489 Other specified disorders of nose and nasal sinuses: Secondary | ICD-10-CM

## 2020-08-20 DIAGNOSIS — G47419 Narcolepsy without cataplexy: Secondary | ICD-10-CM

## 2020-08-20 DIAGNOSIS — N400 Enlarged prostate without lower urinary tract symptoms: Secondary | ICD-10-CM

## 2020-08-20 MED ORDER — HYDROXYZINE HCL 25 MG PO TABS: ORAL_TABLET | ORAL | 1 refills | Status: DC

## 2020-08-20 NOTE — Progress Notes (Signed)
Jesse Cortez is a 64 y.o. male is here to establish care.  I acted as a Neurosurgeon for [employer], PA-C Corky Mull, LPN   History of Present Illness:   Chief Complaint  Patient presents with  . Establish Care  . Mass    Left nostril  . Back Pain    HPI    Nasal Pain/Epistaxis Pt c/o lump in left nostril x several weeks, having nose bleeds everyday few minutes to an hour. Has deviated septum. Has pain with touch. Unable to blow his nose due to pain. Getting worse with time. Blood appears to smear on the kleenex, does not drip/pour out.   Back pain Pt c/o mid back thoracic pain for yrs. Injury in 1997,has had numerous injections. Has had increase in pain the past 3-4 months. Numbness and tingling arms and legs. Tylenol and Lyrica for pain. Does not want to take opioids or other narcotics. Would like to have possible manipulations done to see if this would help his symptoms.   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 Would like to decrease hydroxyzine to see if this helps prevent feeling overly sedated at times in the morning.   BPH Currently taking doxazosin 2 mg daily. Tolerating well. Hasn't seen urology in years, would like referral.   Colonoscopy Reports that he is about 1 year behind on his colonoscopy. During last colonoscopy he reports that they removed >10 polyps.   HTN Currently taking Hydralazine 50 mg 3 times daily and Valsartan 160 mg BID.  He also reports that he takes sildenafil 20 mg daily.  At home blood pressure readings are: not checked. Patient denies chest pain, SOB, blurred vision, dizziness, unusual headaches, lower leg swelling. Patient is compliant with medication. Denies excessive caffeine intake, stimulant usage, excessive alcohol intake, or increase in salt consumption.  BP Readings from Last 3 Encounters:  08/20/20 130/72  02/02/18 (!) 178/119      Health Maintenance Due  Topic Date Due    . Hepatitis C Screening  Never done  . HIV Screening  Never done  . COLONOSCOPY  Never done    Past Medical History:  Diagnosis Date  . Allergy   . Anxiety   . Arthritis   . Blood transfusion without reported diagnosis 1968  . Chronic kidney disease    kidney stones  . COPD (chronic obstructive pulmonary disease) (HCC)   . Depression   . GERD (gastroesophageal reflux disease)   . Hyperlipidemia   . Hypertension   . Neuromuscular disorder (HCC)    neuropathy  . Sleep apnea      Social History   Tobacco Use  . Smoking status: Current Every Day Smoker    Types: E-cigarettes, Cigarettes    Start date: 08/20/1980  . Smokeless tobacco: Never Used  . Tobacco comment: up to 3PPD  Vaping Use  . Vaping Use: Every day  Substance Use Topics  . 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 Drug use: No    Past Surgical History:  Procedure Laterality Date  . 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 KNEE SURGERY Left 1987, 2013  . SHOULDER SURGERY Right 2010   orthoscopy  . TONSILLECTOMY  1964  . VASECTOMY      Family History  Problem Relation Age of Onset  . Heart disease Mother   . COPD Mother   . Dementia Mother   . Diabetes Mother   .  Diabetes Father     PMHx, SurgHx, SocialHx, FamHx, Medications, and Allergies were reviewed in the Visit Navigator and updated as appropriate.   Patient Active Problem List   Diagnosis Date Noted  . Blurred vision, left eye 10/15/2019  . Hyperlipidemia 09/06/2019  . Low testosterone 09/06/2019  . Polyp of colon 09/06/2019  . Former cigarette smoker 12/30/2018  . PTSD (post-traumatic stress disorder) 10/20/2018  . Tremor 07/01/2018  . Pain in joints of right hand 04/27/2018  . Nephrolithiasis 04/19/2018  . Traumatic partial tear of left biceps tendon 03/17/2018  . Cubital tunnel syndrome of both upper extremities 02/07/2018  . Hepatic cyst 02/07/2018  . H/O cardiac catheterization 02/07/2018  . Incomplete tear of  left rotator cuff 02/07/2018  . Renal cyst, left 02/07/2018  . Chronic thoracic spine pain (Primary Area of Pain) 02/02/2018  . Chronic neck pain (Secondary Area of Pain) (R>L) 02/02/2018  . Chronic pain of left upper extremity 02/02/2018  . Chronic elbow pain, left [facility](Tertiary Area of Pain) 02/02/2018  . Chronic pain of both shoulders (Fourth Area of Pain) (L>R) 02/02/2018  . Chronic pain of both knees (L>R) 02/02/2018  . Chronic hip pain, left 02/02/2018  . Chronic groin pain, right 02/02/2018  . Disorder of skeletal system 02/02/2018  . Left elbow pain 02/01/2018  . Benign essential tremor 01/20/2018  . Imbalance 01/20/2018  . Chronic bilateral thoracic back pain 01/10/2018  . H/O sepsis 01/10/2018  . Spinal stenosis of lumbar region 01/10/2018  . COPD (chronic obstructive pulmonary disease) with chronic bronchitis (HCC) 01/04/2018  . Essential hypertension 01/04/2018  . Gastroesophageal reflux disease without esophagitis 01/04/2018  . H/O brain tumor 01/04/2018  . Idiopathic peripheral neuropathy 01/04/2018  . 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 OSA on CPAP 01/04/2018  . Tobacco user 01/04/2018  . Trigeminal neuralgia 01/04/2018    Social History   Tobacco Use  . Smoking status: Current Every Day Smoker    Types: E-cigarettes, Cigarettes    Start date: 08/20/1980  . Smokeless tobacco: Never Used  . Tobacco comment: up to 3PPD  Vaping Use  . Vaping Use: Every day  Substance Use Topics  . 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 Drug use: No    Current Medications and Allergies:    Current Outpatient Medications:  .  aspirin 81 MG chewable tablet, Chew by mouth., Disp: , Rfl:  .  carbamazepine (TEGRETOL XR) 200 MG 12 hr tablet, Take 200 mg by mouth 2 (two) times daily., Disp: , Rfl:  .  doxazosin (CARDURA) 2 MG tablet, Take by mouth., Disp: , Rfl:  .  hydrALAZINE (APRESOLINE) 50 MG tablet, Take 1 tablet by mouth 3 (three) times daily., Disp: , Rfl:  .   modafinil (PROVIGIL) 200 MG tablet, Take by mouth., Disp: , Rfl:  .  NIFEdipine (PROCARDIA-XL/ADALAT CC) 60 MG 24 hr tablet, Take 60 mg by mouth daily., Disp: , Rfl:  .  omeprazole (PRILOSEC) 20 MG capsule, Take 20 mg by mouth daily., Disp: , Rfl:  .  pregabalin (LYRICA) 225 MG capsule, Take 225 mg by mouth 2 (two) times daily., Disp: , Rfl:  .  propranolol (INDERAL) 20 MG tablet, Take 20 mg by mouth daily., Disp: , Rfl:  .  propranolol ER (INDERAL LA) 160 MG SR capsule, Take 1 capsule by mouth every morning., Disp: , Rfl:  .  sildenafil (REVATIO) 20 MG tablet, TAKE 2 TABLETS BY MOUTH ONCE DAILY AS NEEDED 1  HOUR  BEFORE  INTERCOURSE, Disp: , Rfl:  .  valsartan (DIOVAN) 160 MG tablet, Take 160 mg by mouth 2 (two) times daily. , Disp: , Rfl:  .  venlafaxine (EFFEXOR) 100 MG tablet, Take 100 mg by mouth 2 (two) times daily., Disp: , Rfl:  .  hydrOXYzine (ATARAX/VISTARIL) 25 MG tablet, Take one 25 mg tablet nightly, Disp: 90 tablet, Rfl: 1   Allergies  Allergen Reactions  . Bee Venom Anaphylaxis  . Naproxen Anaphylaxis  . Penicillin G Anaphylaxis  . Drug Ingredient [Monosodium Glutamate] Other (See Comments)    Headache  . Primidone     Other reaction(s): Unknown  . Topiramate Other (See Comments)    "Petit mal seizures"     Review of Systems   ROS  Negative unless otherwise specified per HPI.  Vitals:   Vitals:   08/20/20 1442  BP: 130/72  Pulse: (!) 56  Temp: 98 F (36.7 C)  TempSrc: Temporal  SpO2: 96%  Weight: 297 lb (134.7 kg)  Height: 6\' 2"  (1.88 m)     Body mass index is 38.13 kg/m.   Physical Exam:    Physical Exam Vitals and nursing note reviewed.  Constitutional:      General: He is not in acute distress.    Appearance: He is well-developed. He is not ill-appearing or toxic-appearing.  HENT:     Nose:     Right Nostril: No epistaxis.     Left Nostril: Epistaxis present. No foreign body.  Cardiovascular:     Rate and Rhythm: Normal rate and regular  rhythm.     Pulses: Normal pulses.     Heart sounds: Normal heart sounds, S1 normal and S2 normal.     Comments: No LE edema Pulmonary:     Effort: Pulmonary effort is normal.     Breath sounds: Normal breath sounds.  Skin:    General: Skin is warm and dry.  Neurological:     Mental Status: He is alert.     GCS: GCS eye subscore is 4. GCS verbal subscore is 5. GCS motor subscore is 6.  Psychiatric:        Speech: Speech normal.        Behavior: Behavior normal. Behavior is cooperative.      Assessment and Plan:    [place] was seen today for establish care, mass and back pain.  Diagnoses and all orders for this visit:  Epistaxis; Nasal pain Referral to ear nose and throat for further evaluation and management. No red flags on exam that require urgent attention today. -     Ambulatory referral to ENT  Chronic thoracic spine pain (Primary Area of Pain) Will send to sports medicine for possible manipulation per patient request. -     Ambulatory referral to Sports Medicine  Narcolepsy without cataplexy Patient would like to decrease hydroxyzine to 25 mg daily.  I have sent in this new dose for him.  Continue other medications per neurology's recommendations.  Benign prostatic hyperplasia, unspecified whether lower urinary tract symptoms present Referral to urology per patient request. -     Ambulatory referral to Urology  Special screening for malignant neoplasms, colon Patient is agreeable to updating his colonoscopy, referral placed. -     Ambulatory referral to Gastroenterology  Essential hypertension Patient is normotensive in office today. Continue current regimen of hydralazine 50 mg 3 times daily and valsartan 160 mg twice daily. He also takes sildenafil 20 mg daily.  Other orders -  hydrOXYzine (ATARAX/VISTARIL) 25 MG tablet; Take one 25 mg tablet nightly   CMA or LPN served as scribe during this visit. History, Physical, and Plan performed by medical  provider. The above documentation has been reviewed and is accurate and complete.  Time spent with patient today was 45 minutes which consisted of chart review, discussing diagnosis, work up, treatment answering questions and documentation.  Jarold Motto, PA-C [place] [place] 08/20/2020  Follow-up: No follow-ups on file.

## 2020-08-20 NOTE — Patient Instructions (Signed)
It was great to see you!  Referrals today for: -colonoscopy -ear nose and throat -urology -sports medicine  Decrease hydroxyzine to 25 mg daily.  Let's follow-up in 3 months, sooner if you have concerns.  Take care,  Jarold Motto PA-C

## 2020-08-26 ENCOUNTER — Encounter (HOSPITAL_COMMUNITY): Payer: Self-pay | Admitting: Emergency Medicine

## 2020-08-26 ENCOUNTER — Emergency Department (HOSPITAL_COMMUNITY)
Admission: EM | Admit: 2020-08-26 | Discharge: 2020-08-27 | Disposition: A | Discharge status: Home / Self Care | Payer: Medicare HMO | Attending: Emergency Medicine | Admitting: Emergency Medicine

## 2020-08-26 ENCOUNTER — Emergency Department (HOSPITAL_COMMUNITY): Payer: Medicare HMO

## 2020-08-26 ENCOUNTER — Telehealth: Payer: Self-pay

## 2020-08-26 DIAGNOSIS — R419 Unspecified symptoms and signs involving cognitive functions and awareness: Secondary | ICD-10-CM | POA: Insufficient documentation

## 2020-08-26 DIAGNOSIS — Z5321 Procedure and treatment not carried out due to patient leaving prior to being seen by health care provider: Secondary | ICD-10-CM | POA: Insufficient documentation

## 2020-08-26 DIAGNOSIS — R202 Paresthesia of skin: Secondary | ICD-10-CM | POA: Diagnosis not present

## 2020-08-26 DIAGNOSIS — R519 Headache, unspecified: Secondary | ICD-10-CM | POA: Diagnosis present

## 2020-08-26 IMAGING — CT CT HEAD W/O CM
4 series · 16 of 47 positions shown, 18 images · non-contrast
Comparison: None.

CLINICAL DATA: Numbness or tingling, paresthesias.  Head trauma.

EXAM:
CT HEAD WITHOUT CONTRAST
TECHNIQUE: Contiguous axial images were obtained from the base of the skull
through the vertex without intravenous contrast.

[Series 3: head without · axial · non-contrast · 0.50mm/px · z∈[-47,+73]mm · 7 of 34 slices shown, 9 images]
[im 5/34  brain]
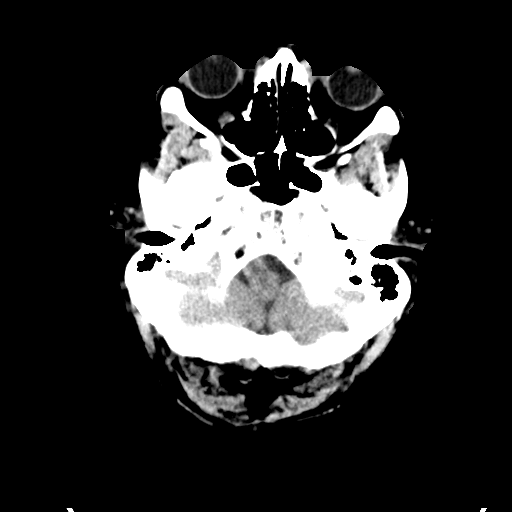
[im 5/34  bone]
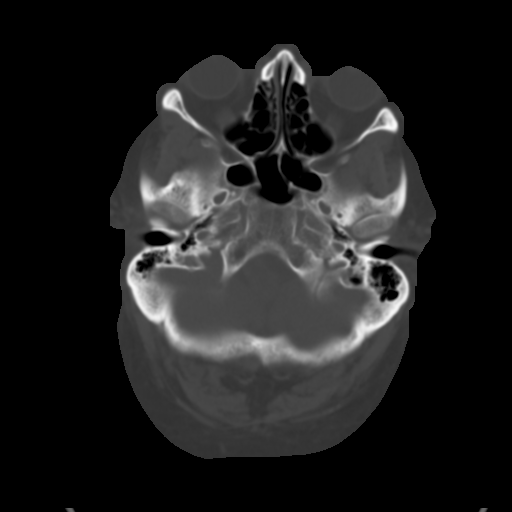
[im 9/34  brain]
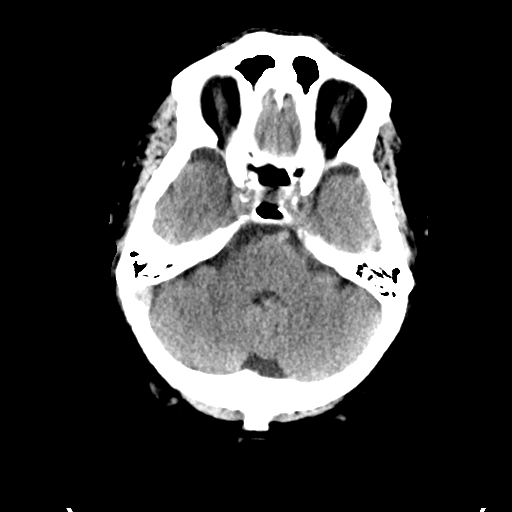
[im 13/34  brain]
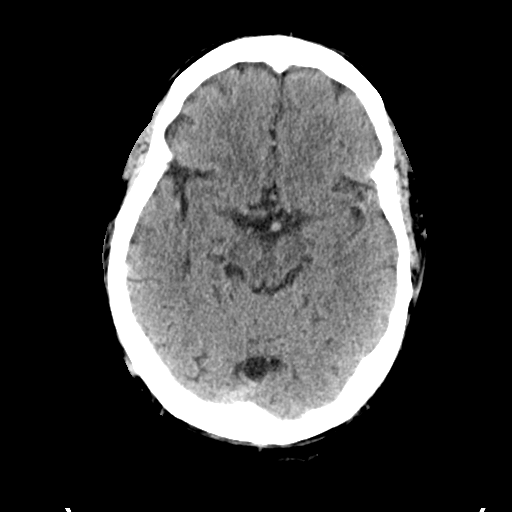
[im 17/34  brain]
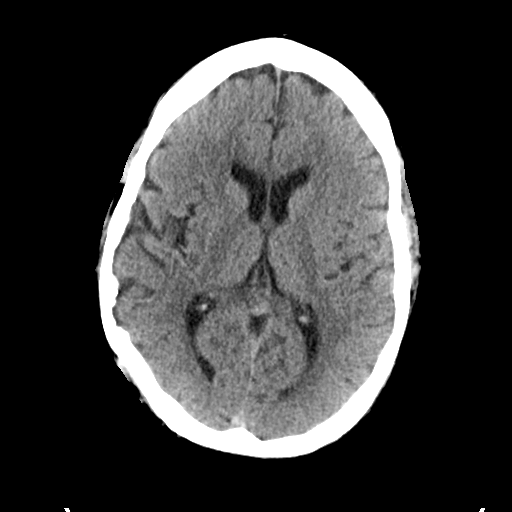
[im 21/34  brain]
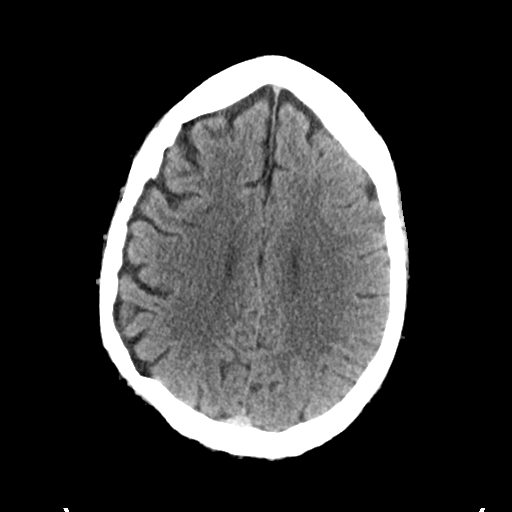
[im 21/34  bone]
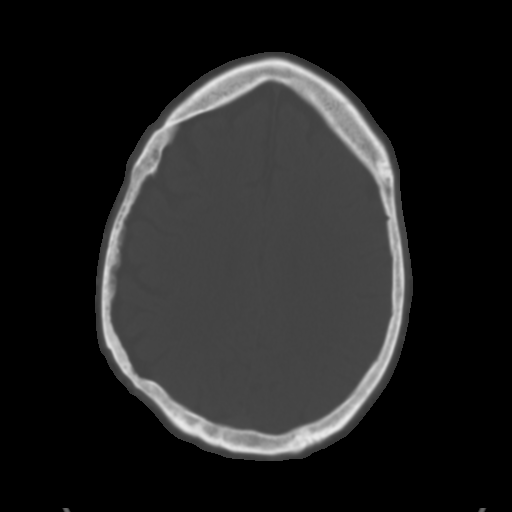
[im 25/34  brain]
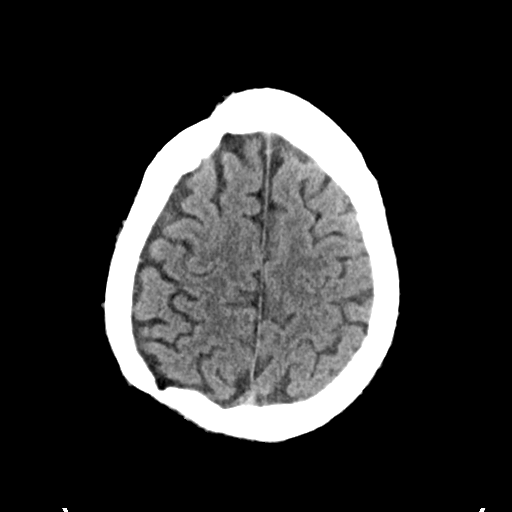
[im 29/34  brain]
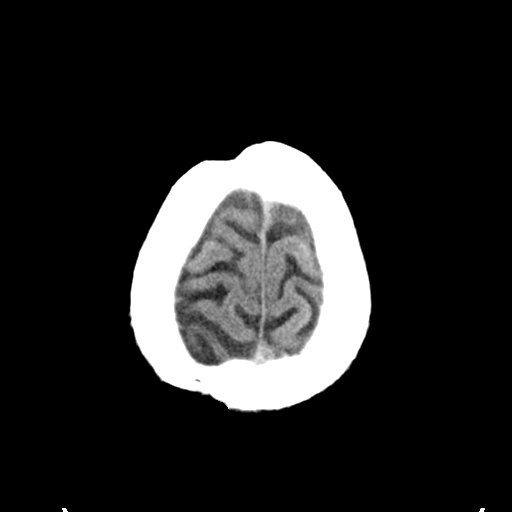

[Series 4: head bone · axial · 0.50mm/px · z∈[-51,-17]mm · 3 of 85 slices shown]
[im 9/85  bone]
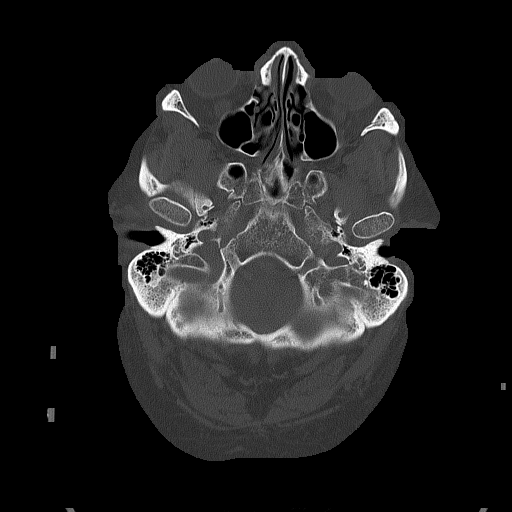
[im 17/85  bone]
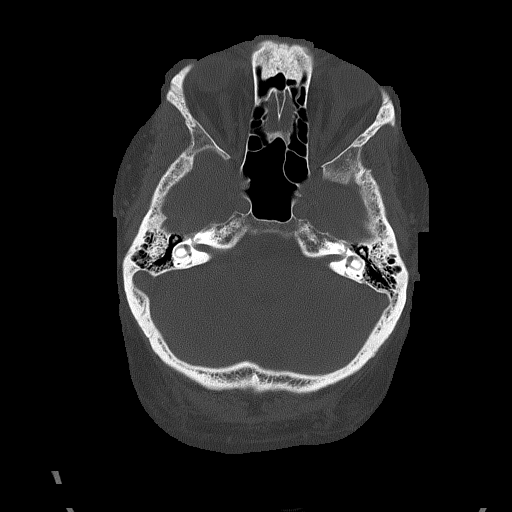
[im 26/85  bone]
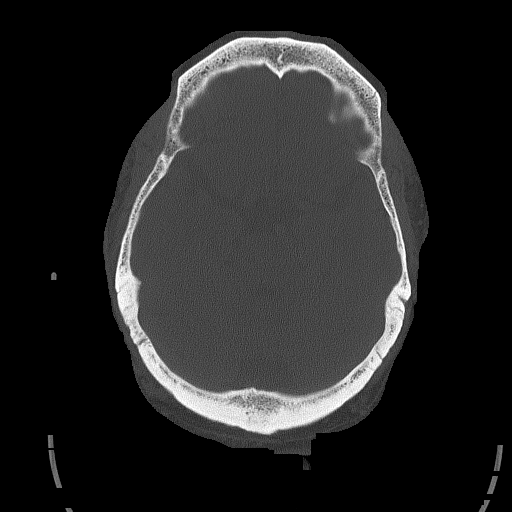

[Series 5: head without cor · coronal · non-contrast · 0.35mm/px · 3 of 76 slices shown]
[im 26/76  brain]
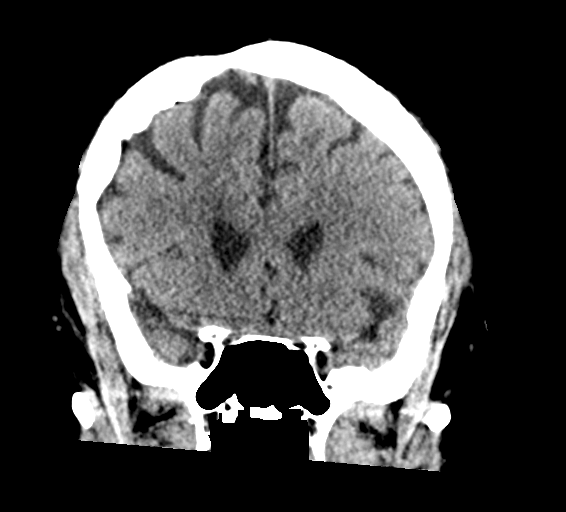
[im 34/76  brain]
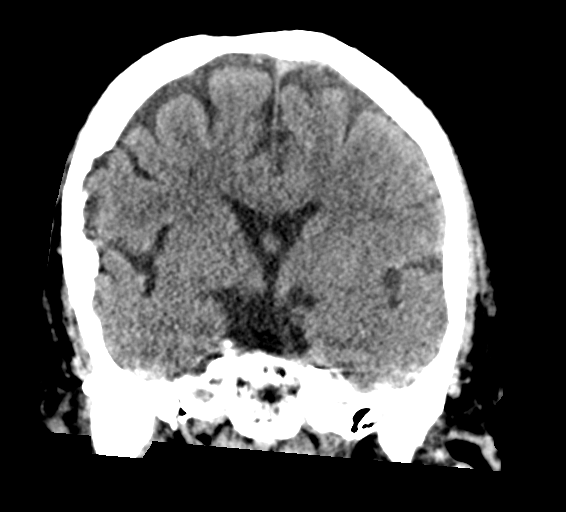
[im 42/76  brain]
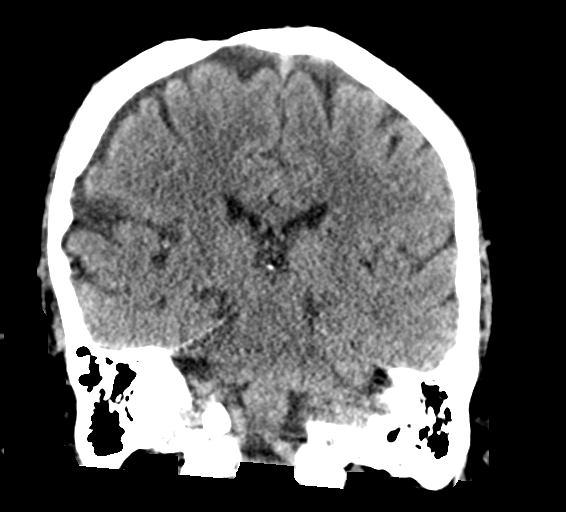

[Series 6: head without sag · sagittal · non-contrast · 0.34mm/px · 3 of 67 slices shown]
[im 23/67  brain]
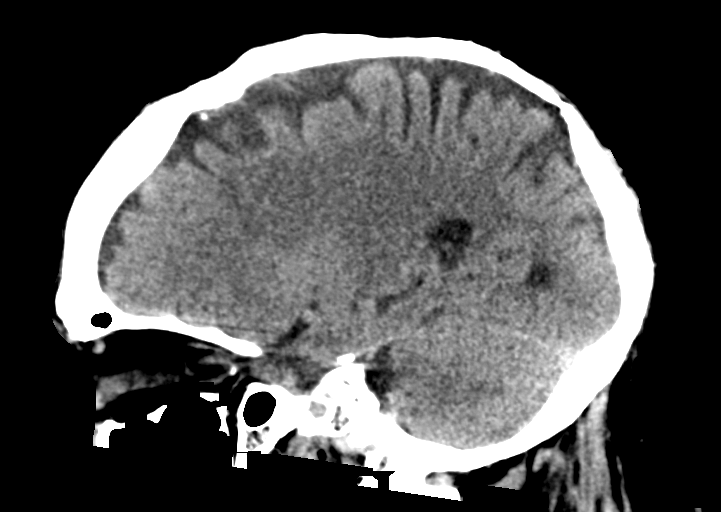
[im 34/67  brain]
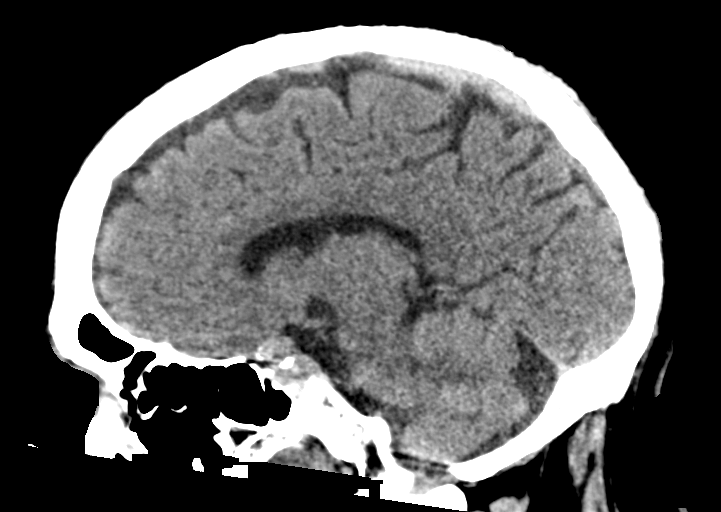
[im 45/67  brain]
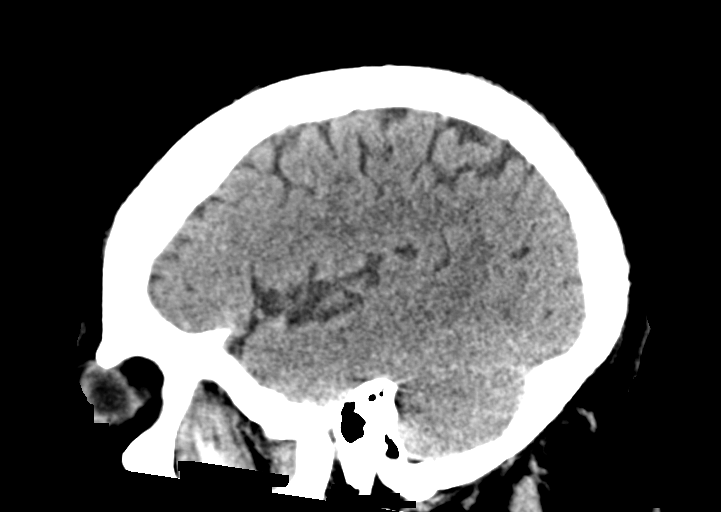

[16 of 47 positions shown; findings below may reference images not displayed]

FINDINGS: Brain: No evidence of acute large vascular territory infarction,
hemorrhage, hydrocephalus, extra-axial collection or mass
lesion/mass effect. Mild prominence of the retro cerebellar CSF
space, possibly representing mega cisterna magna versus small
arachnoid cyst.

Vascular: No hyperdense vessel identified. Calcific atherosclerosis.

Skull: Findings consistent with a remote right frontoparietal
craniotomy. No evidence of acute fracture.

Sinuses/Orbits: Mild ethmoid air cell mucosal thickening. Otherwise,
the sinuses are clear. No acute orbital abnormality.

Other: No mastoid effusions.
IMPRESSION: No evidence of acute intracranial abnormality.

## 2020-08-26 NOTE — ED Notes (Signed)
Pt said he can no longer. Pt told the risk of leaving. Pt said he will be going primary doctor tomorrow.
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
## 2020-08-27 ENCOUNTER — Other Ambulatory Visit: Payer: Self-pay | Admitting: Physician Assistant

## 2020-08-27 ENCOUNTER — Encounter: Payer: Self-pay | Admitting: Physician Assistant

## 2020-08-28 ENCOUNTER — Telehealth: Payer: Self-pay

## 2020-08-28 NOTE — Telephone Encounter (Signed)
Left message for patient to call back to schedule in concussion clinic. [facility] referral.

## 2020-09-02 ENCOUNTER — Encounter: Payer: Self-pay | Admitting: Family Medicine

## 2020-09-02 ENCOUNTER — Ambulatory Visit (INDEPENDENT_AMBULATORY_CARE_PROVIDER_SITE_OTHER): Payer: Medicare HMO | Admitting: Family Medicine

## 2020-09-02 ENCOUNTER — Other Ambulatory Visit: Payer: Self-pay

## 2020-09-02 VITALS — BP 138/84 | HR 60 | Ht 74.0 in | Wt 302.6 lb

## 2020-09-02 DIAGNOSIS — S060X0A Concussion without loss of consciousness, initial encounter: Secondary | ICD-10-CM

## 2020-09-02 DIAGNOSIS — G8929 Other chronic pain: Secondary | ICD-10-CM | POA: Diagnosis not present

## 2020-09-02 DIAGNOSIS — M546 Pain in thoracic spine: Secondary | ICD-10-CM | POA: Diagnosis not present

## 2020-09-02 DIAGNOSIS — R42 Dizziness and giddiness: Secondary | ICD-10-CM | POA: Diagnosis not present

## 2020-09-02 NOTE — Patient Instructions (Addendum)
Thank you for coming in today.  Plan for vestibular physical therapy to work on balance.  Recheck in 2 weeks.  Keep me updated.  Continue the current treatment.   Plan for regular PT.  Please get an Xray today before you leave
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
## 2020-09-16 ENCOUNTER — Ambulatory Visit: Payer: Medicare HMO | Admitting: Family Medicine
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
## 2020-09-17 ENCOUNTER — Other Ambulatory Visit: Payer: Self-pay

## 2020-09-17 ENCOUNTER — Ambulatory Visit (INDEPENDENT_AMBULATORY_CARE_PROVIDER_SITE_OTHER): Payer: Medicare HMO

## 2020-09-17 ENCOUNTER — Ambulatory Visit (INDEPENDENT_AMBULATORY_CARE_PROVIDER_SITE_OTHER): Payer: Medicare HMO | Admitting: Family Medicine

## 2020-09-17 ENCOUNTER — Encounter: Payer: Self-pay | Admitting: Family Medicine

## 2020-09-17 VITALS — BP 120/80 | HR 48 | Ht 74.0 in | Wt 297.2 lb

## 2020-09-17 DIAGNOSIS — S060X0D Concussion without loss of consciousness, subsequent encounter: Secondary | ICD-10-CM

## 2020-09-17 DIAGNOSIS — R42 Dizziness and giddiness: Secondary | ICD-10-CM

## 2020-09-17 DIAGNOSIS — M546 Pain in thoracic spine: Secondary | ICD-10-CM

## 2020-09-17 DIAGNOSIS — S060X0A Concussion without loss of consciousness, initial encounter: Secondary | ICD-10-CM | POA: Diagnosis not present

## 2020-09-17 DIAGNOSIS — R2689 Other abnormalities of gait and mobility: Secondary | ICD-10-CM | POA: Diagnosis not present

## 2020-09-17 DIAGNOSIS — G8929 Other chronic pain: Secondary | ICD-10-CM

## 2020-09-17 DIAGNOSIS — H532 Diplopia: Secondary | ICD-10-CM

## 2020-09-17 IMAGING — DX DG THORACIC SPINE 2V
3 series · 3 of 3 positions shown · non-contrast
Comparison: No prior.

CLINICAL DATA: Chronic back pain.

EXAM:
THORACIC SPINE 2 VIEWS

[t-spine ap]
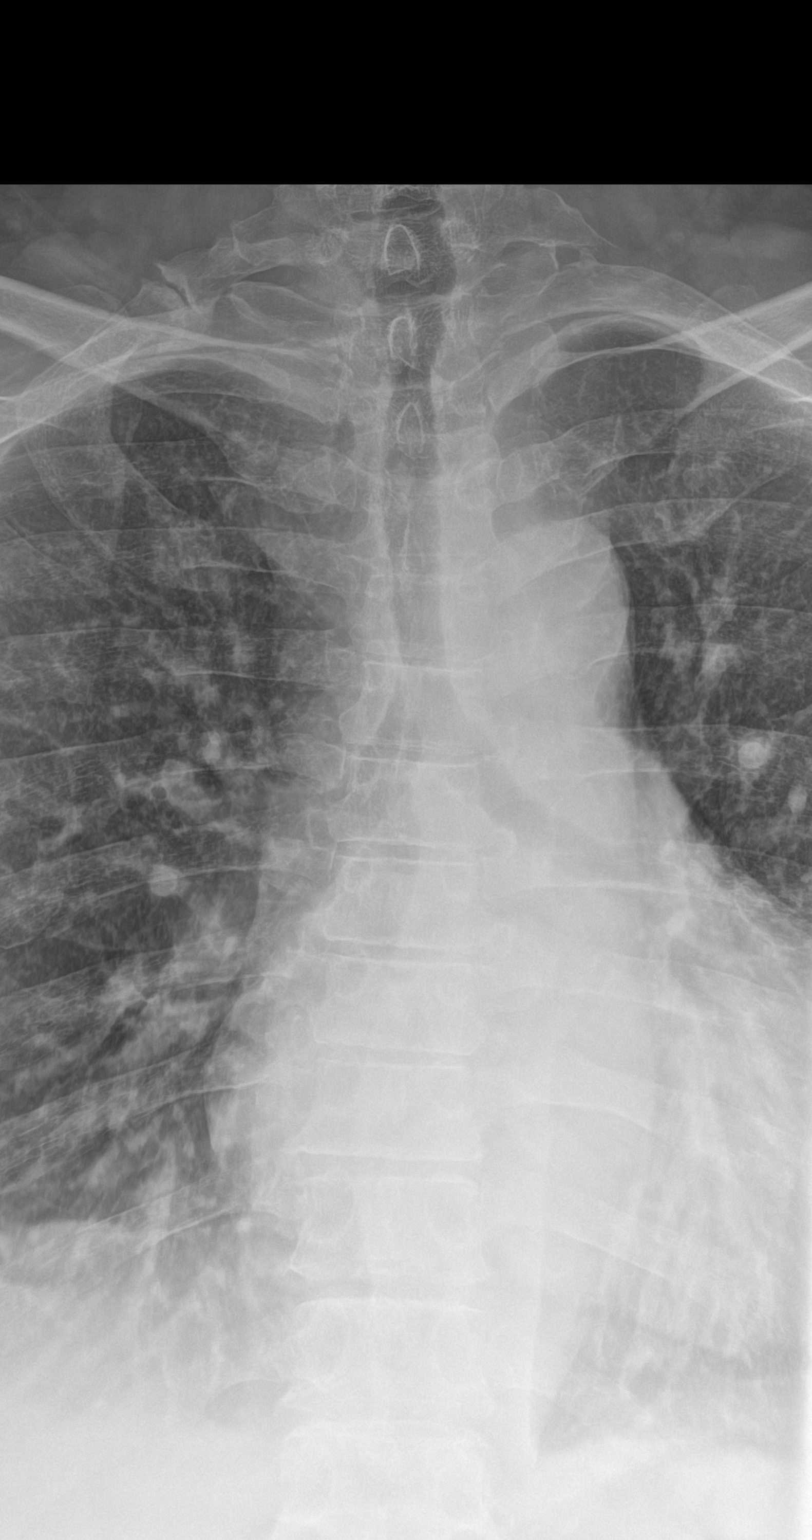

[t-spine lat]
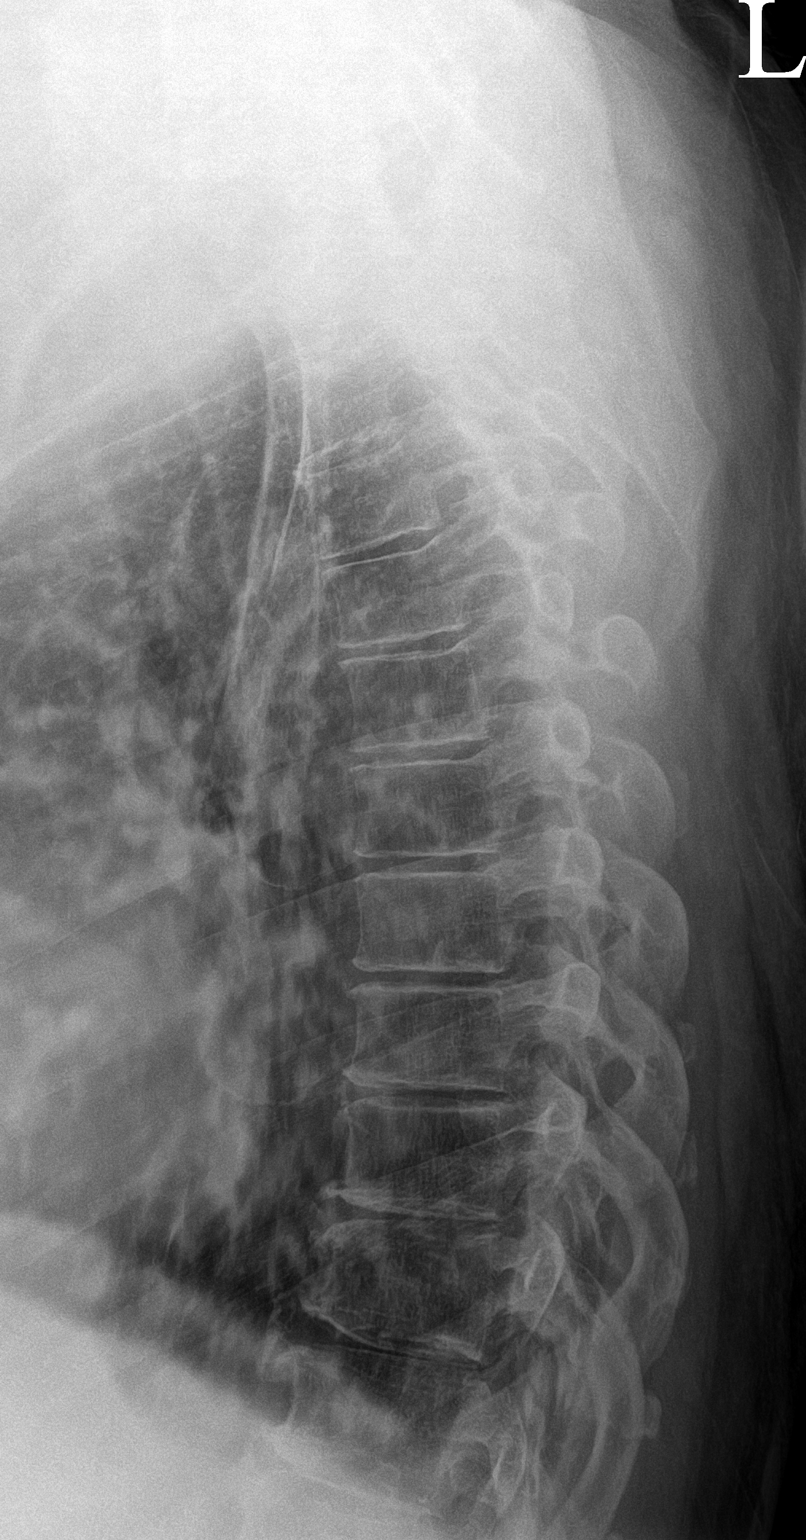

[ct-spine swimmers]
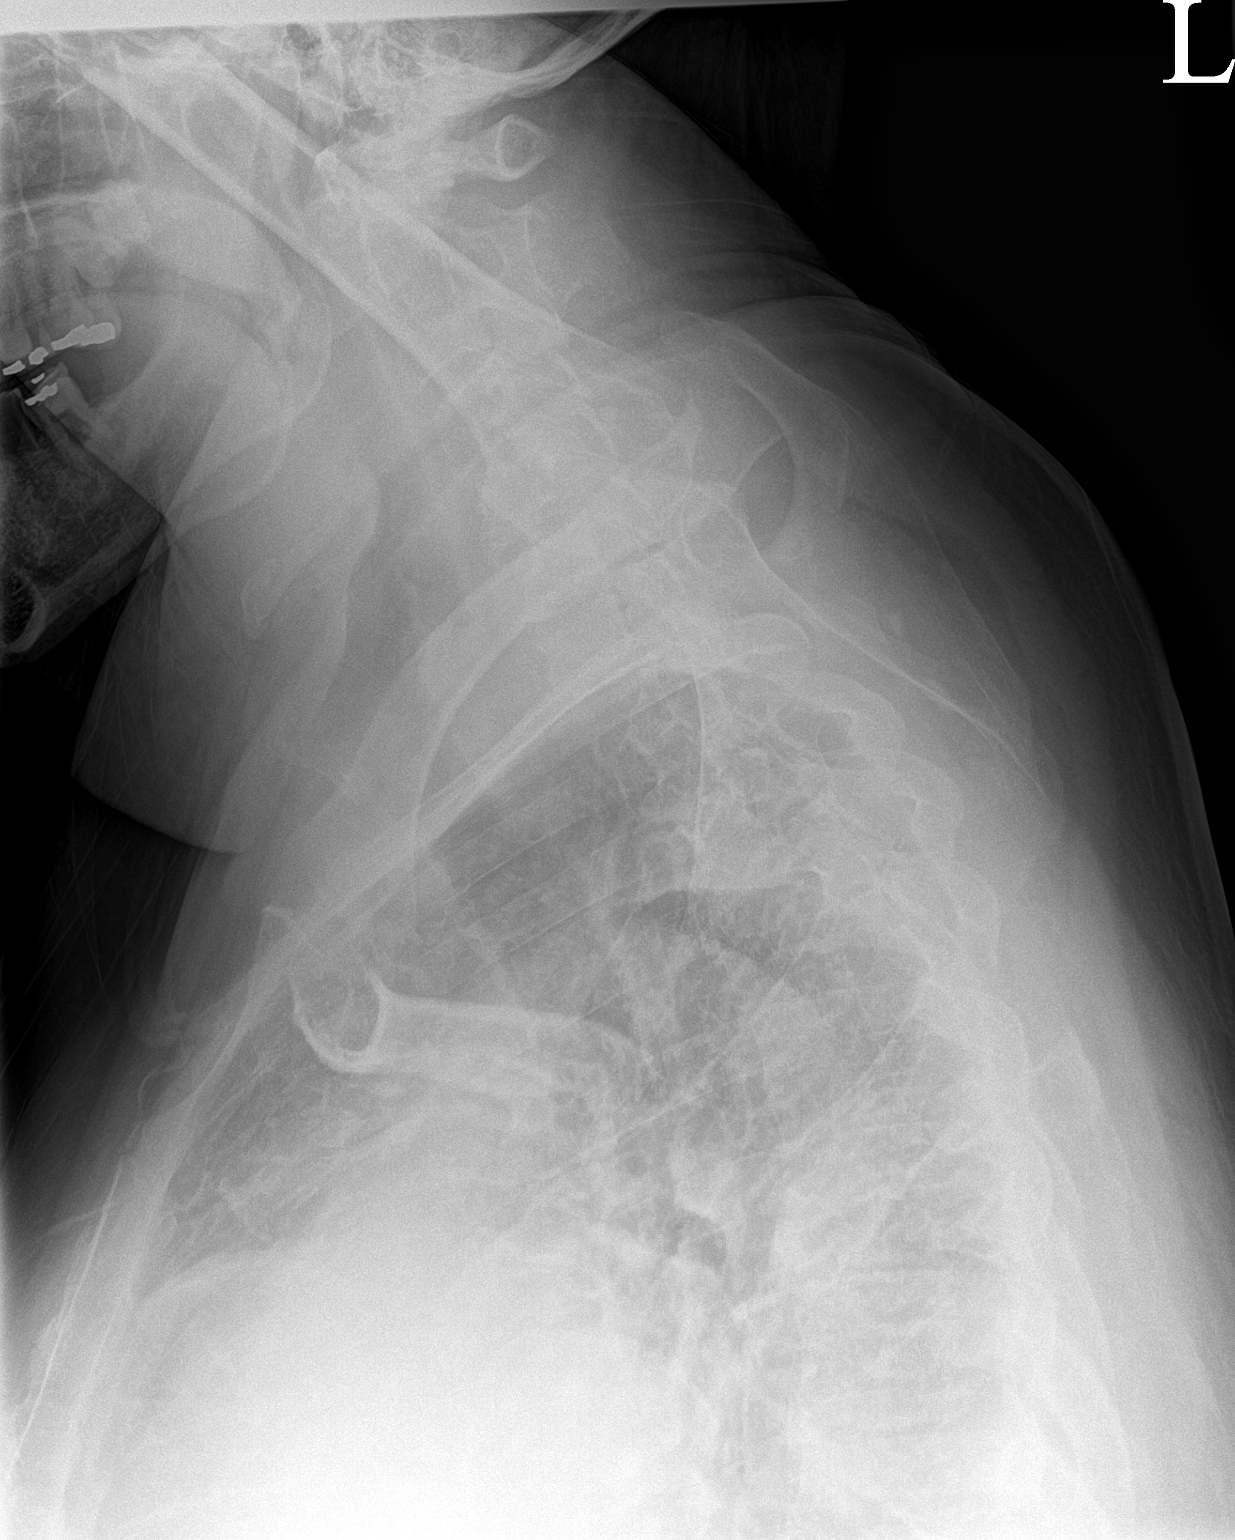

[3 of 3 positions shown; findings below may reference images not displayed]

FINDINGS: Diffuse multilevel degenerative change thoracic spine. No acute bony
abnormality identified. No evidence of fracture. Paraspinal soft
tissues are unremarkable.
IMPRESSION: Diffuse multilevel degenerative change thoracic spine. No acute
abnormality identified.

## 2020-09-17 NOTE — Patient Instructions (Addendum)
Thank you for coming in today.  Proceed to physical therapy especially for the dizzy.  We will contact you with the result of the xray today.   [facility] Address: [address], [place], [place] [address] Phone: [phone] Neurophthalmology   Plan for MRI Tspine.   Recheck following MRI likely in 2-3 weeks.   Keep me updated.

## 2020-09-19 NOTE — Progress Notes (Signed)
X-ray thoracic spine shows diffuse multilevel arthritis changes.  No fractures visible.

## 2020-09-22 ENCOUNTER — Encounter: Payer: Self-pay | Admitting: Physician Assistant

## 2020-09-22 ENCOUNTER — Encounter: Payer: Self-pay | Admitting: Family Medicine

## 2020-09-23 ENCOUNTER — Ambulatory Visit (INDEPENDENT_AMBULATORY_CARE_PROVIDER_SITE_OTHER): Payer: Medicare HMO

## 2020-09-23 ENCOUNTER — Other Ambulatory Visit: Payer: Self-pay

## 2020-09-23 DIAGNOSIS — M546 Pain in thoracic spine: Secondary | ICD-10-CM

## 2020-09-23 DIAGNOSIS — M47814 Spondylosis without myelopathy or radiculopathy, thoracic region: Secondary | ICD-10-CM | POA: Diagnosis not present

## 2020-09-23 DIAGNOSIS — G8929 Other chronic pain: Secondary | ICD-10-CM

## 2020-09-23 IMAGING — MR MR THORACIC SPINE W/O CM
6 series · 43 of 48 positions shown · non-contrast
Comparison: Plain films [DATE].

CLINICAL DATA: Mid back pain.  Neuro deficit.

EXAM:
MRI THORACIC SPINE WITHOUT CONTRAST
TECHNIQUE: Multiplanar, multisequence MR imaging of the thoracic spine was
performed. No intravenous contrast was administered.

[Series 10: T1 · sagittal · 7.0mm · 1.41mm/px · 2 of 7 slices shown (1 of 2)]
[im 1/7]
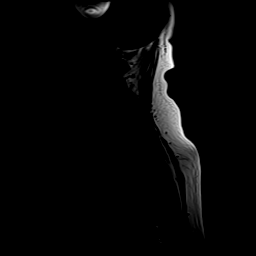
[im 7/7]
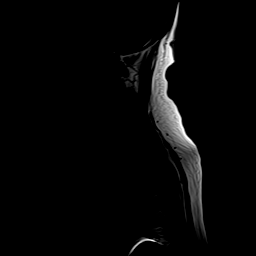

[Series 11: T1 · sagittal · 3.0mm · 1.00mm/px · 6 of 15 slices shown (2 of 2)]
[im 1/15]
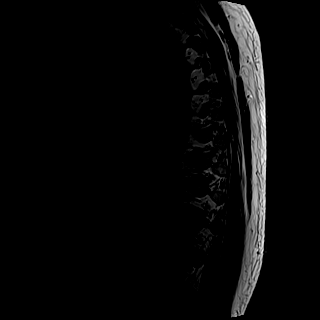
[im 3/15]
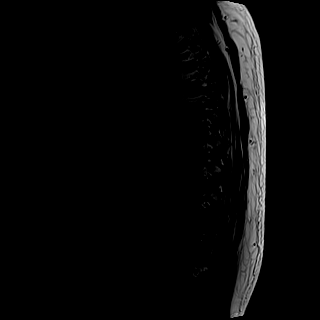
[im 6/15]
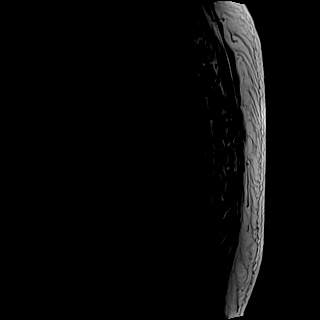
[im 9/15]
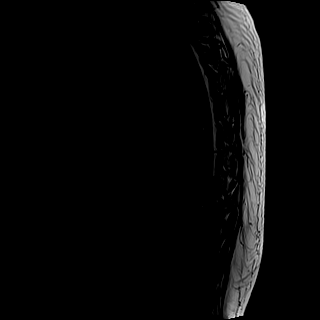
[im 12/15]
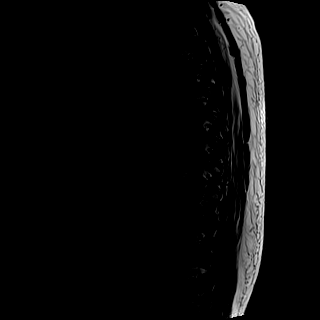
[im 15/15]
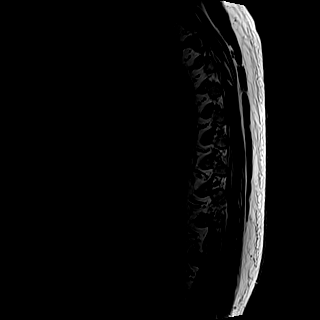

[Series 12: T2 · sagittal · 3.0mm · 1.00mm/px · 6 of 15 slices shown (1 of 2)]
[im 1/15]
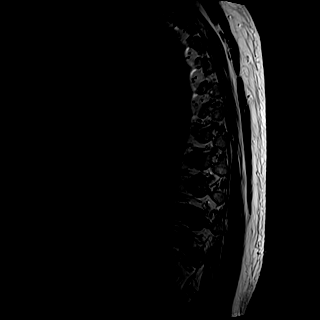
[im 3/15]
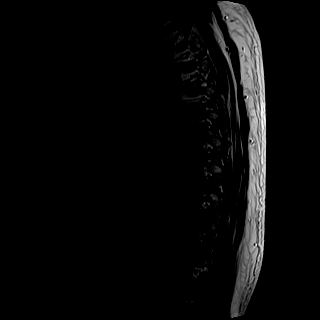
[im 6/15]
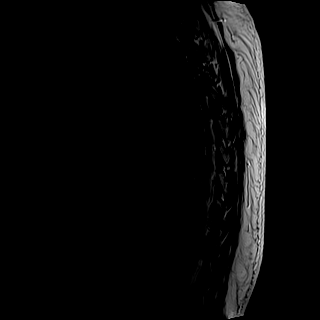
[im 9/15]
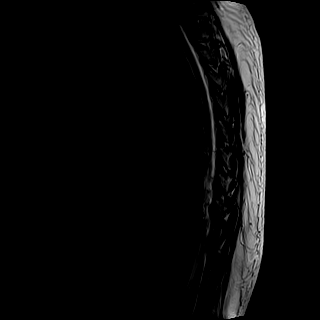
[im 12/15]
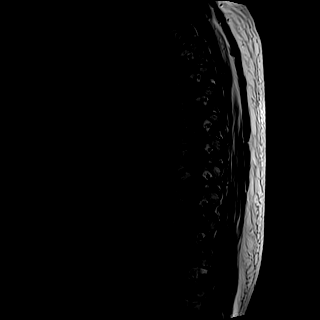
[im 15/15]
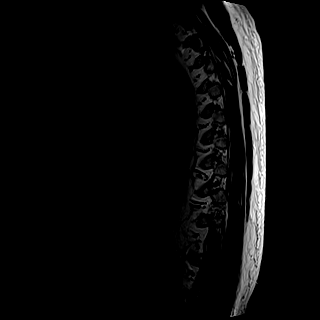

[Series 13: STIR · sagittal · 3.0mm · 1.00mm/px · 6 of 15 slices shown]
[im 1/15]
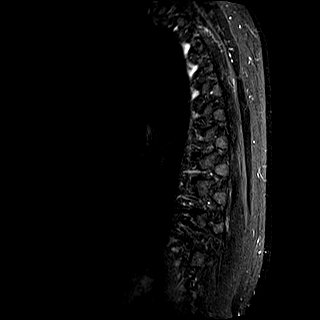
[im 3/15]
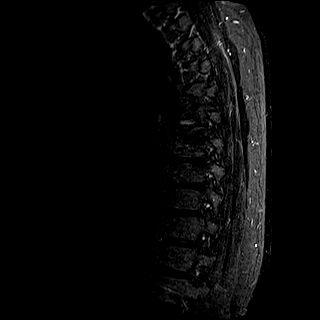
[im 6/15]
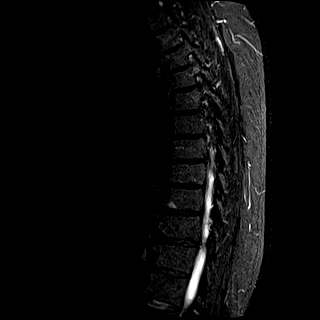
[im 9/15]
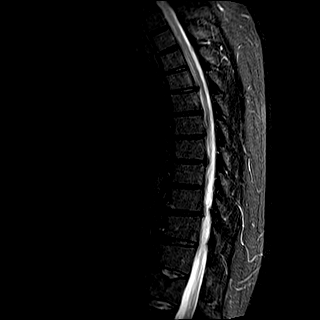
[im 12/15]
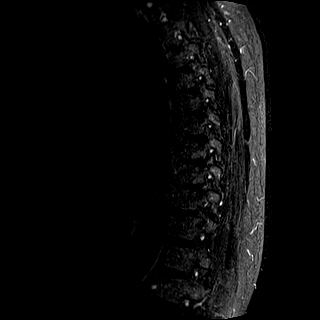
[im 15/15]
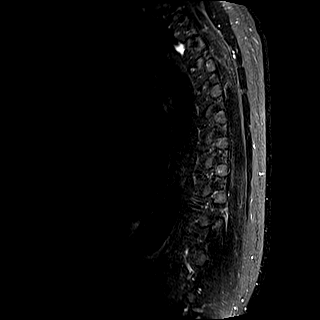

[Series 100: T2 · axial · 5.0mm · 0.86mm/px · z∈[-302,-76]mm · 14 of 39 slices shown (2 of 2)]
[im 1/39]
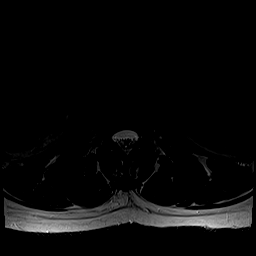
[im 3/39]
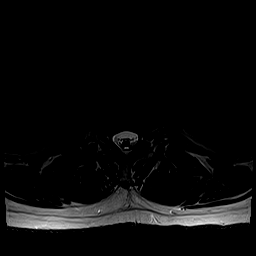
[im 6/39]
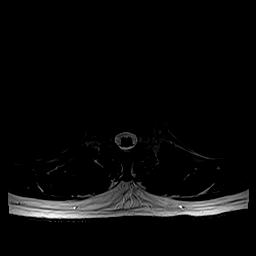
[im 9/39]
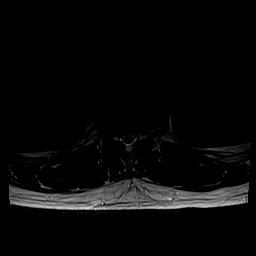
[im 12/39]
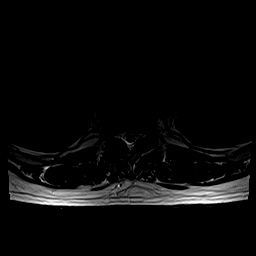
[im 15/39]
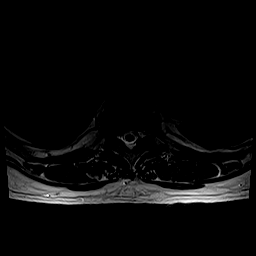
[im 18/39]
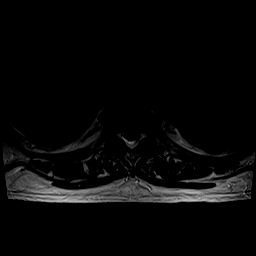
[im 21/39]
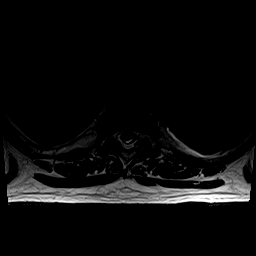
[im 24/39]
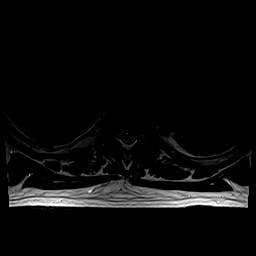
[im 27/39]
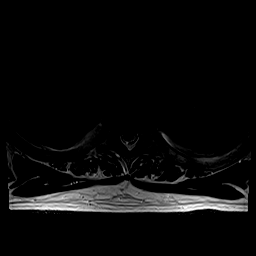
[im 30/39]
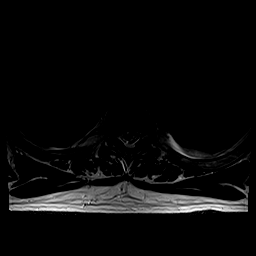
[im 33/39]
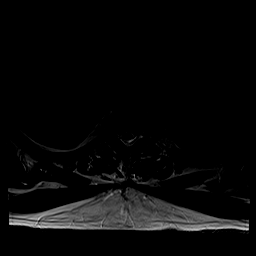
[im 36/39]
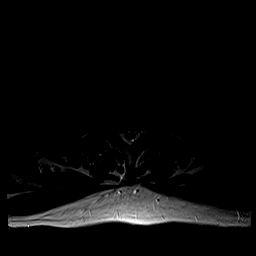
[im 39/39]
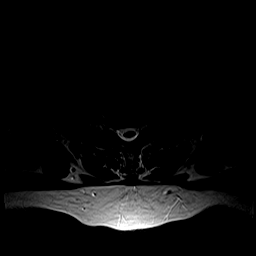

[Series 101: ax mpgr composed · axial · 5.0mm · 0.35mm/px · z∈[-317,-56]mm · 9 of 39 slices shown]
[im 1/39]
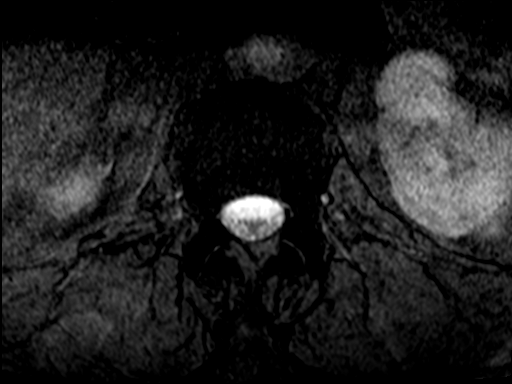
[im 3/39]
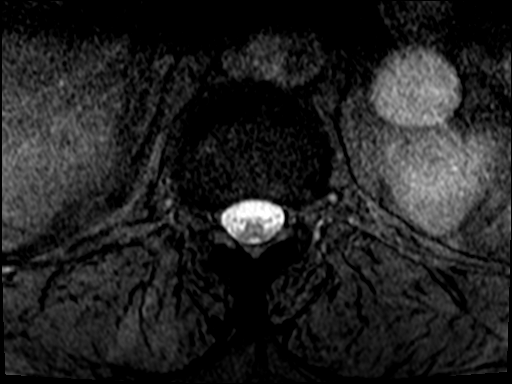
[im 6/39]
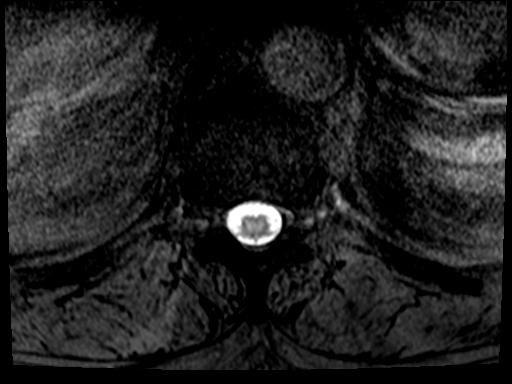
[im 12/39]
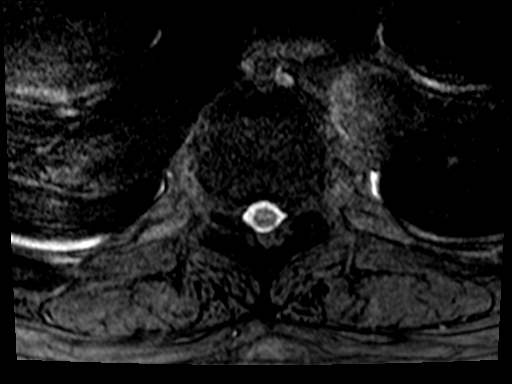
[im 18/39]
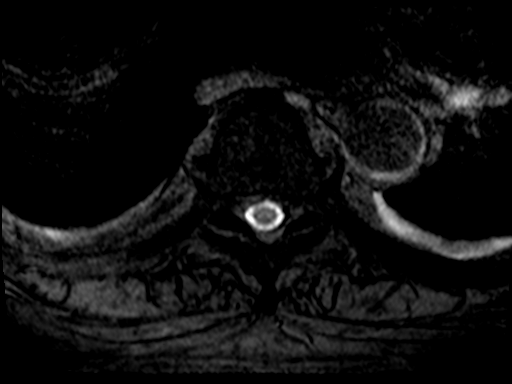
[im 21/39]
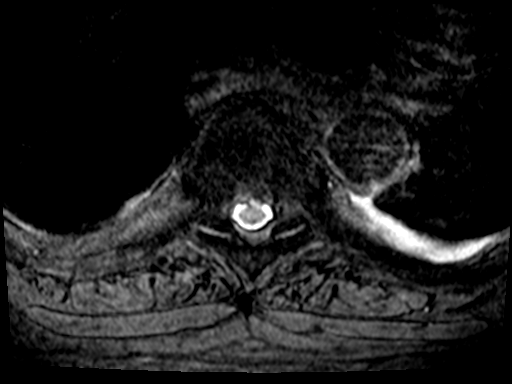
[im 27/39]
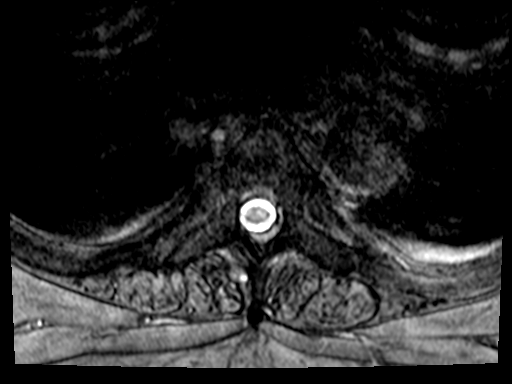
[im 33/39]
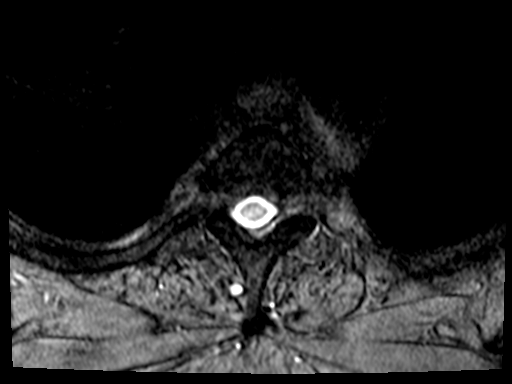
[im 39/39]
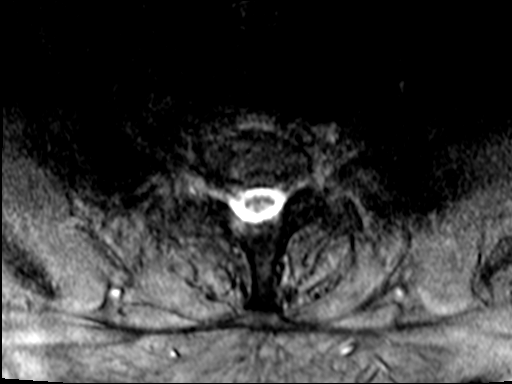

[43 of 48 positions shown; findings below may reference images not displayed]

FINDINGS: Alignment:  Physiologic.

Vertebrae: No fracture, evidence of discitis, or bone lesion.
Schmorl node in the superior endplate of T12.

Cord:  Normal signal and morphology.

Paraspinal and other soft tissues: 3 cm left renal cyst.

Disc levels:

T1-2, T2-3 and T3-4: No spinal canal or neural foraminal stenosis.

T4-5, T5-6, T6-7 and T7-8: Posterior disc protrusions without
significant spinal canal or neural foraminal stenosis.

T8-9: Small right foraminal disc protrusion resulting mild right
neural foraminal stenosis. No spinal canal stenosis.

T9-10: Facet degenerative changes. No spinal canal or neural
foraminal stenosis.

T10-11: Right paracentral disc protrusion and facet degenerative
changes. No significant spinal canal or neural foraminal stenosis.

T11-12 and T12-L1: No spinal canal or neural foraminal stenosis.
IMPRESSION: Mild thoracic spondylosis as described above with mild right neural
foraminal stenosis at T8-9. No significant spinal canal stenosis at
any level.

## 2020-09-23 NOTE — Telephone Encounter (Signed)
Jesse Cortez, please see message and advise does pt need appt with you or Sports Medicine?

## 2020-09-24 NOTE — Progress Notes (Signed)
MRI thoracic spine shows no compression fractures.  Some arthritis is present in the thoracic spine.  Mild narrowing of the space where the right T9 nerve root needs to pass through.  No frank nerve compression seen.  We will discuss this in full detail at scheduled follow-up with me later this month.

## 2020-09-25 ENCOUNTER — Encounter: Payer: Self-pay | Admitting: Internal Medicine

## 2020-09-26 ENCOUNTER — Ambulatory Visit (INDEPENDENT_AMBULATORY_CARE_PROVIDER_SITE_OTHER): Payer: Medicare HMO | Admitting: Otolaryngology

## 2020-09-26 ENCOUNTER — Other Ambulatory Visit: Payer: Self-pay

## 2020-09-26 ENCOUNTER — Encounter (INDEPENDENT_AMBULATORY_CARE_PROVIDER_SITE_OTHER): Payer: Self-pay | Admitting: Otolaryngology

## 2020-09-26 VITALS — Temp 96.6°F

## 2020-09-26 DIAGNOSIS — J3489 Other specified disorders of nose and nasal sinuses: Secondary | ICD-10-CM

## 2020-09-26 DIAGNOSIS — J310 Chronic rhinitis: Secondary | ICD-10-CM | POA: Diagnosis not present

## 2020-09-26 NOTE — Progress Notes (Signed)
HPI: Jesse Cortez is a 64 y.o. male who presents is referred by his PCP for evaluation of history of epistaxis as well as scabbing and crusting in his nose.  He also notices a nodule in the left nostril.  He has intermittent trouble breathing through his nose.. Patient has obstructive sleep apnea and uses nasal CPAP at night.  Past Medical History:  Diagnosis Date  . Allergy   . Anxiety   . Arthritis   . Blood transfusion without reported diagnosis 1968  . Chronic kidney disease    kidney stones  . COPD (chronic obstructive pulmonary disease) (HCC)   . Depression   . GERD (gastroesophageal reflux disease)   . Hyperlipidemia   . Hypertension   . Neuromuscular disorder (HCC)    neuropathy  . Sleep apnea    Past Surgical History:  Procedure Laterality Date  . 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 KNEE SURGERY Left 1987, 2013  . SHOULDER SURGERY Right 2010   orthoscopy  . TONSILLECTOMY  1964  . VASECTOMY     Social History   Socioeconomic History  . Marital status: Single    Spouse name: Not on file  . Number of children: Not on file  . Years of education: Not on file  . Highest education level: Not on file  Occupational History  . Not on file  Tobacco Use  . Smoking status: Current Every Day Smoker    Years: 40.00    Types: E-cigarettes, Cigarettes    Start date: 08/20/1980  . Smokeless tobacco: Never Used  . Tobacco comment: up to 3PPD  Vaping Use  . Vaping Use: Every day  Substance and Sexual Activity  . 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 Drug use: No  . Sexual activity: Not Currently  Other Topics Concern  . Not on file  Social History Narrative   Lives with daughter   History of law enforcement and investigation   Social Determinants of Health   Financial Resource Strain:   . Difficulty of Paying Living Expenses: Not on file  Food Insecurity:   . Worried About Programme researcher, broadcasting/film/video in the Last Year: Not on file  . Ran Out of  Food in the Last Year: Not on file  Transportation Needs:   . Lack of Transportation (Medical): Not on file  . Lack of Transportation (Non-Medical): Not on file  Physical Activity:   . Days of Exercise per Week: Not on file  . Minutes of Exercise per Session: Not on file  Stress:   . Feeling of Stress : Not on file  Social Connections:   . Frequency of Communication with Friends and Family: Not on file  . Frequency of Social Gatherings with Friends and Family: Not on file  . Attends Religious Services: Not on file  . Active Member of Clubs or Organizations: Not on file  . Attends Banker Meetings: Not on file  . Marital Status: Not on file   Family History  Problem Relation Age of Onset  . Heart disease Mother   . COPD Mother   . Dementia Mother   . Diabetes Mother   . Diabetes Father    Allergies  Allergen Reactions  . Bee Venom Anaphylaxis  . Naproxen Anaphylaxis  . Penicillin G Anaphylaxis  . Drug Ingredient [Monosodium Glutamate] Other (See Comments)    Headache  . Primidone     Other reaction(s): Unknown  .  Topiramate Other (See Comments)    "Petit mal seizures"    Prior to Admission medications   Medication Sig Start Date End Date Taking? Authorizing Provider  aspirin 81 MG chewable tablet Chew by mouth.   Yes [provider]  carbamazepine (TEGRETOL XR) 200 MG 12 hr tablet Take 200 mg by mouth 2 (two) times daily.   Yes [provider]  doxazosin (CARDURA) 2 MG tablet Take by mouth. 11/20/19  Yes [provider]  hydrALAZINE (APRESOLINE) 50 MG tablet Take 1 tablet by mouth 3 (three) times daily. 12/26/19  Yes [provider]  hydrOXYzine (ATARAX/VISTARIL) 25 MG tablet Take one 25 mg tablet nightly 08/20/20  Yes Worley, [place], PA  modafinil (PROVIGIL) 200 MG tablet Take by mouth. 12/21/19  Yes [provider]  NIFEdipine (PROCARDIA-XL/ADALAT CC) 60 MG 24 hr tablet Take 60 mg by mouth daily.   Yes [provider]  omeprazole (PRILOSEC) 20 MG capsule Take 20 mg by mouth daily.   Yes [provider]  pregabalin (LYRICA) 225 MG capsule Take 225 mg by mouth 2 (two) times daily.   Yes [provider]  propranolol (INDERAL) 20 MG tablet Take 20 mg by mouth daily. 07/19/20  Yes [provider]  propranolol ER (INDERAL LA) 160 MG SR capsule Take 1 capsule by mouth every morning. 07/13/19  Yes [provider]  sildenafil (REVATIO) 20 MG tablet TAKE 2 TABLETS BY MOUTH ONCE DAILY AS NEEDED 1  HOUR  BEFORE  INTERCOURSE 12/21/19  Yes [provider]  valsartan (DIOVAN) 160 MG tablet Take 160 mg by mouth 2 (two) times daily.    Yes [provider]  venlafaxine (EFFEXOR) 100 MG tablet Take 100 mg by mouth 2 (two) times daily.   Yes [provider]     Positive ROS: Otherwise negative  All other systems have been reviewed and were otherwise negative with the exception of those mentioned in the HPI and as above.  Physical Exam: Constitutional: Alert, well-appearing, no acute distress Ears: External ears without lesions or tenderness. Ear canals are clear bilaterally with intact, clear TMs.  On hearing screening with a tuning forks he has a mild sensorineural hearing loss. Nasal: External nose without lesions. Septum slightly deviated to the left.  He has moderate rhinitis.  Both middle meatus regions are clear.  No signs of infection.  No polyps..  The nodule he feels in the left nose represents slightly deviated septum.  The area of bleeding is inferiorly along the nostril where he has been picking at his nose he has a sore along the inferior portion of both nostrils with slight scabbing. Oral: Lips and gums without lesions. Tongue and palate mucosa without lesions. Posterior oropharynx clear. Neck: No palpable adenopathy or masses Respiratory: Breathing comfortably  Skin: No facial/neck lesions or rash noted.  Procedures  Assessment: The  bleeding is from the nasal vestibule where he has some scabbing crusting and has been picking in his nose. He has moderate rhinitis and slight septal deviation to the left.  Plan: Recommended use of mupirocin 2% ointment application to the nostrils twice daily for 1 week and this should help resolve the crusting and soreness in the nose as well as the bleeding. Recommended not picking at his nose.Marland Kitchen He has Flonase that he can use if he has much nasal congestion.   Narda Bonds, MD   CC:

## 2020-10-08 ENCOUNTER — Other Ambulatory Visit: Payer: Self-pay

## 2020-10-08 ENCOUNTER — Encounter: Payer: Self-pay | Admitting: Physical Therapy

## 2020-10-08 ENCOUNTER — Encounter: Payer: Self-pay | Admitting: Physician Assistant

## 2020-10-08 ENCOUNTER — Ambulatory Visit (INDEPENDENT_AMBULATORY_CARE_PROVIDER_SITE_OTHER): Payer: Medicare HMO | Admitting: Family Medicine

## 2020-10-08 ENCOUNTER — Ambulatory Visit: Payer: Medicare HMO | Attending: Family Medicine | Admitting: Physical Therapy

## 2020-10-08 ENCOUNTER — Encounter: Payer: Self-pay | Admitting: Family Medicine

## 2020-10-08 VITALS — BP 130/88 | HR 62 | Ht 74.0 in | Wt 290.8 lb

## 2020-10-08 DIAGNOSIS — R2689 Other abnormalities of gait and mobility: Secondary | ICD-10-CM | POA: Insufficient documentation

## 2020-10-08 DIAGNOSIS — R2681 Unsteadiness on feet: Secondary | ICD-10-CM | POA: Diagnosis present

## 2020-10-08 DIAGNOSIS — R42 Dizziness and giddiness: Secondary | ICD-10-CM | POA: Diagnosis not present

## 2020-10-08 DIAGNOSIS — S060X0D Concussion without loss of consciousness, subsequent encounter: Secondary | ICD-10-CM
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
## 2020-10-08 NOTE — Patient Instructions (Signed)
Thank you for coming in today.  Plan to attend vestibular PT.   Recheck in about 4-6 weeks or sooner if needed.   Ok to advance activity.
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
## 2020-10-09 NOTE — Telephone Encounter (Signed)
Please schedule pt for appointment.  Thank You 

## 2020-10-15 ENCOUNTER — Ambulatory Visit: Payer: Medicare HMO | Admitting: Physical Therapy

## 2020-10-23 ENCOUNTER — Encounter: Payer: Self-pay | Admitting: Physician Assistant

## 2020-10-24 ENCOUNTER — Ambulatory Visit: Payer: Medicare HMO | Admitting: Physical Therapy

## 2020-10-29 ENCOUNTER — Encounter: Payer: Self-pay | Admitting: Physical Therapy

## 2020-10-29 ENCOUNTER — Other Ambulatory Visit: Payer: Self-pay

## 2020-10-29 ENCOUNTER — Ambulatory Visit: Payer: Medicare HMO | Attending: Family Medicine | Admitting: Physical Therapy

## 2020-10-29 DIAGNOSIS — R2689 Other abnormalities of gait and mobility: Secondary | ICD-10-CM | POA: Diagnosis present

## 2020-10-29 DIAGNOSIS — R2681 Unsteadiness on feet: Secondary | ICD-10-CM | POA: Diagnosis present

## 2020-10-29 NOTE — Patient Instructions (Signed)
Standing Marching   Using a chair if necessary, march in place. Repeat 10 times. Do 1 sessions per day.  http://[email or web address].exer.us/344   Hip Backward Kick - ALTERNATING LEGS     Using a chair for balance, keep legs shoulder width apart and toes pointed for- ward. Slowly extend one leg back, keeping knee straight. Do not lean forward. Repeat with other leg. Repeat 10 times. Do 1 sessions per day.  ALSO DO FORWARD KICKS - ALTERNATING LEGS - 10 REPS EACH LEG  http://[email or web address].exer.us/340     Hip Side Kick - alternating legs    Holding a chair for balance, keep legs shoulder width apart and toes pointed forward. Swing a leg out to side, keeping knee straight. Do not lean. Repeat using other leg. Repeat 10 times. Do 1 sessions per day.  http://[email or web address].exer.us/342     Feet Heel-Toe "Tandem", Varied Arm Positions - Eyes Open - do partial heel to toe position   With eyes open, right foot directly in front of the other, arms out, look straight ahead at a stationary object. Hold 30 seconds. Repeat 1 times per session. Do 1 sessions per day.    Standing On One Leg Without Support .  Stand on one leg in neutral spine without support. Hold 10 seconds. Repeat on other leg. Do 2 repetitions, 1 set.  http://bt.exer.us/36    Side-Stepping   Walk to left side with eyes open. Take even steps, leading with same foot. Make sure each foot lifts off the floor. Repeat in opposite direction. Repeat for 2-3 times along counter. Do 1 sessions per day.  Add head turns for increased difficulty.

## 2020-10-30 ENCOUNTER — Encounter: Payer: Self-pay | Admitting: Physician Assistant

## 2020-10-30 MED ORDER — VENLAFAXINE HCL 100 MG PO TABS: 100.0000 mg | ORAL_TABLET | Freq: Two times a day (BID) | ORAL | 1 refills | Status: DC
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
## 2020-10-31 ENCOUNTER — Ambulatory Visit: Payer: Medicare HMO | Admitting: Physical Therapy

## 2020-11-05 ENCOUNTER — Ambulatory Visit: Payer: Medicare HMO | Admitting: Physical Therapy

## 2020-11-14 ENCOUNTER — Ambulatory Visit: Payer: Medicare HMO | Admitting: Physical Therapy

## 2020-11-14 NOTE — Progress Notes (Deleted)
   I, Philbert Riser, LAT, ATC acting as a scribe for Clementeen Graham, MD.  Jesse Cortez is a 64 y.o. male who presents to [facility] today for f/u concussion and MRI review. Pt was last seen by Dr. Denyse Amass on 10/08/20 and was advised to begin vestibular therapy of which he's completed 2 visits and also advised for some watchful waiting. Today, pt reports  Dx imaging: 09/23/20 Thoracic spine MRI                     08/26/20 CT  Injury date : 08/25/20 Visit #: 4  Pertinent review of systems: ***  Relevant historical information: ***   Exam:  There were no vitals taken for this visit. General: Well Developed, well nourished, and in no acute distress.   MSK: ***    Lab and Radiology Results No results found for this or any previous visit (from the past 72 hour(s)). No results found.     Assessment and Plan: 64 y.o. male with ***   PDMP not reviewed this encounter. No orders of the defined types were placed in this encounter.  No orders of the defined types were placed in this encounter.    Discussed warning signs or symptoms. Please see discharge instructions. Patient expresses understanding.   ***

## 2020-11-18 ENCOUNTER — Ambulatory Visit: Payer: Medicare HMO | Admitting: Family Medicine

## 2020-11-19 ENCOUNTER — Ambulatory Visit: Payer: Medicare HMO | Admitting: Physical Therapy

## 2020-11-20 ENCOUNTER — Ambulatory Visit (INDEPENDENT_AMBULATORY_CARE_PROVIDER_SITE_OTHER): Payer: Medicare HMO | Admitting: Physician Assistant

## 2020-11-20 ENCOUNTER — Encounter: Payer: Self-pay | Admitting: Physician Assistant

## 2020-11-20 ENCOUNTER — Other Ambulatory Visit: Payer: Self-pay

## 2020-11-20 VITALS — BP 156/100 | HR 59 | Temp 97.0°F | Ht 74.0 in | Wt 294.0 lb

## 2020-11-20 DIAGNOSIS — R21 Rash and other nonspecific skin eruption: Secondary | ICD-10-CM | POA: Diagnosis not present

## 2020-11-20 DIAGNOSIS — E669 Obesity, unspecified: Secondary | ICD-10-CM

## 2020-11-20 DIAGNOSIS — Z136 Encounter for screening for cardiovascular disorders: Secondary | ICD-10-CM | POA: Diagnosis not present

## 2020-11-20 DIAGNOSIS — Z13220 Encounter for screening for lipoid disorders: Secondary | ICD-10-CM

## 2020-11-20 DIAGNOSIS — I10 Essential (primary) hypertension: Secondary | ICD-10-CM

## 2020-11-20 MED ORDER — VALSARTAN 160 MG PO TABS: 160.0000 mg | ORAL_TABLET | Freq: Two times a day (BID) | ORAL | 3 refills | Status: DC

## 2020-11-20 MED ORDER — PROPRANOLOL HCL ER 160 MG PO CP24: 160.0000 mg | ORAL_CAPSULE | Freq: Every morning | ORAL | 2 refills | Status: DC

## 2020-11-20 MED ORDER — HYDRALAZINE HCL 50 MG PO TABS: 50.0000 mg | ORAL_TABLET | Freq: Three times a day (TID) | ORAL | 3 refills | Status: DC

## 2020-11-20 MED ORDER — DOXYCYCLINE HYCLATE 100 MG PO TABS: 100.0000 mg | ORAL_TABLET | Freq: Two times a day (BID) | ORAL | 0 refills | Status: DC

## 2020-11-20 MED ORDER — PERMETHRIN 5 % EX CREA: TOPICAL_CREAM | CUTANEOUS | 0 refills | Status: DC

## 2020-11-20 NOTE — Progress Notes (Signed)
Jesse Cortez is a 65 y.o. male is here for follow up.  I acted as a Neurosurgeon for [employer], PA-C Corky Mull, LPN   History of Present Illness:   Chief Complaint  Patient presents with  . Hypertension  . Rash    HPI    Hypertension Pt is following up today, currently taking Hydralazine 50 mg TID and Valsartan 25 mg BID. Has been out of Hydralazine for a few weeks. Pt has not been checking blood pressure at home. Pt has been having headache, dizziness blurred vision, some SOB on exertion has been for years. Also having lower leg edema.  6-8 cups caffeine intake, no stimulant usage, excessive alcohol intake or increase in salt consumption.  Wt Readings from Last 3 Encounters:  11/20/20 294 lb (133.4 kg)  10/08/20 290 lb 12.8 oz (131.9 kg)  09/17/20 297 lb 3.2 oz (134.8 kg)   States that SOB is due to his obesity. Denies chest pain with exertion, radiation of pain.   Rash Pt has rash on arms, legs and back, having itching. Started beginning of November. Pt has used Calamine lotion, Polysporin. Denies contacts with similar rashes, purulent discharge, recent changes in environmental exposures, fever, chills, malaise. Reports hx of staph infection per his report.   HLD Statin hesitant. Would like to update his statin.   Health Maintenance Due  Topic Date Due  . Hepatitis C Screening  Never done  . HIV Screening  Never done  . COLONOSCOPY (Pts 45-94yrs Insurance coverage will need to be confirmed)  Never done    Past Medical History:  Diagnosis Date  . Allergy   . Anxiety   . Arthritis   . Blood transfusion without reported diagnosis 1968  . Chronic kidney disease    kidney stones  . COPD (chronic obstructive pulmonary disease) (HCC)   . Depression   . GERD (gastroesophageal reflux disease)   . Hyperlipidemia   . Hypertension   . Neuromuscular disorder (HCC)    neuropathy  . Sleep apnea      Social History   Tobacco Use  . Smoking status:  Current Every Day Smoker    Years: 40.00    Types: E-cigarettes, Cigarettes    Start date: 08/20/1980  . Smokeless tobacco: Never Used  . Tobacco comment: up to 3PPD  Vaping Use  . Vaping Use: Every day  Substance Use Topics  . 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 Drug use: No    Past Surgical History:  Procedure Laterality Date  . 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 KNEE SURGERY Left 1987, 2013  . SHOULDER SURGERY Right 2010   orthoscopy  . TONSILLECTOMY  1964  . VASECTOMY      Family History  Problem Relation Age of Onset  . Heart disease Mother   . COPD Mother   . Dementia Mother   . Diabetes Mother   . Diabetes Father     PMHx, SurgHx, SocialHx, FamHx, Medications, and Allergies were reviewed in the Visit Navigator and updated as appropriate.   Patient Active Problem List   Diagnosis Date Noted  . Blurred vision, left eye 10/15/2019  . Hyperlipidemia 09/06/2019  . Low testosterone 09/06/2019  . Polyp of colon 09/06/2019  . Former cigarette smoker 12/30/2018  . PTSD (post-traumatic stress disorder) 10/20/2018  . Tremor 07/01/2018  . Pain in joints of right hand 04/27/2018  . Nephrolithiasis 04/19/2018  . Traumatic partial  tear of left biceps tendon 03/17/2018  . Cubital tunnel syndrome of both upper extremities 02/07/2018  . Hepatic cyst 02/07/2018  . H/O cardiac catheterization 02/07/2018  . Incomplete tear of left rotator cuff 02/07/2018  . Renal cyst, left 02/07/2018  . Chronic thoracic spine pain (Primary Area of Pain) 02/02/2018  . Chronic neck pain (Secondary Area of Pain) (R>L) 02/02/2018  . Chronic pain of left upper extremity 02/02/2018  . Chronic elbow pain, left [facility] Area of Pain) 02/02/2018  . Chronic pain of both shoulders (Fourth Area of Pain) (L>R) 02/02/2018  . Chronic pain of both knees (L>R) 02/02/2018  . Chronic hip pain, left 02/02/2018  . Chronic groin pain, right 02/02/2018  . Disorder of skeletal system  02/02/2018  . Left elbow pain 02/01/2018  . Benign essential tremor 01/20/2018  . Imbalance 01/20/2018  . Chronic bilateral thoracic back pain 01/10/2018  . H/O sepsis 01/10/2018  . Spinal stenosis of lumbar region 01/10/2018  . COPD (chronic obstructive pulmonary disease) with chronic bronchitis ([place]) 01/04/2018  . Essential hypertension 01/04/2018  . Gastroesophageal reflux disease without esophagitis 01/04/2018  . H/O brain tumor 01/04/2018  . Idiopathic peripheral neuropathy 01/04/2018  . 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 OSA on CPAP 01/04/2018  . Tobacco user 01/04/2018  . Trigeminal neuralgia 01/04/2018    Social History   Tobacco Use  . Smoking status: Current Every Day Smoker    Years: 40.00    Types: E-cigarettes, Cigarettes    Start date: 08/20/1980  . Smokeless tobacco: Never Used  . Tobacco comment: up to 3PPD  Vaping Use  . Vaping Use: Every day  Substance Use Topics  . 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 Drug use: No    Current Medications and Allergies:    Current Outpatient Medications:  .  aspirin 81 MG chewable tablet, Chew by mouth., Disp: , Rfl:  .  carbamazepine (TEGRETOL XR) 200 MG 12 hr tablet, Take 200 mg by mouth 2 (two) times daily., Disp: , Rfl:  .  doxazosin (CARDURA) 2 MG tablet, Take by mouth., Disp: , Rfl:  .  doxycycline (VIBRA-TABS) 100 MG tablet, Take 1 tablet (100 mg total) by mouth 2 (two) times daily., Disp: 20 tablet, Rfl: 0 .  hydrOXYzine (ATARAX/VISTARIL) 25 MG tablet, Take one 25 mg tablet nightly, Disp: 90 tablet, Rfl: 1 .  modafinil (PROVIGIL) 200 MG tablet, Take by mouth., Disp: , Rfl:  .  omeprazole (PRILOSEC) 20 MG capsule, Take 20 mg by mouth daily., Disp: , Rfl:  .  permethrin (ELIMITE) 5 % cream, Apply to entire body; leave on for 8 to 14 hours before washing off with water. May repeat this regimen daily for 7 days, and then twice weekly until symptoms have resolved., Disp: 60 g, Rfl: 0 .  pregabalin  (LYRICA) 225 MG capsule, Take 225 mg by mouth 2 (two) times daily., Disp: , Rfl:  .  propranolol (INDERAL) 20 MG tablet, Take 20 mg by mouth as needed., Disp: , Rfl:  .  sildenafil (REVATIO) 20 MG tablet, TAKE 2 TABLETS BY MOUTH ONCE DAILY AS NEEDED 1  HOUR  BEFORE  INTERCOURSE, Disp: , Rfl:  .  venlafaxine (EFFEXOR) 100 MG tablet, Take 1 tablet (100 mg total) by mouth 2 (two) times daily., Disp: 180 tablet, Rfl: 1 .  hydrALAZINE (APRESOLINE) 50 MG tablet, Take 1 tablet (50 mg total) by mouth 3 (three) times daily., Disp: 270 tablet, Rfl: 3 .  propranolol  ER (INDERAL LA) 160 MG SR capsule, Take 1 capsule (160 mg total) by mouth every morning., Disp: 90 capsule, Rfl: 2 .  valsartan (DIOVAN) 160 MG tablet, Take 1 tablet (160 mg total) by mouth 2 (two) times daily., Disp: 180 tablet, Rfl: 3   Allergies  Allergen Reactions  . Bee Venom Anaphylaxis  . Naproxen Anaphylaxis  . Penicillin G Anaphylaxis  . Drug Ingredient [Monosodium Glutamate] Other (See Comments)    Headache  . Primidone     Other reaction(s): Unknown  . Topiramate Other (See Comments)    "Petit mal seizures"     Review of Systems   ROS  Negative unless otherwise specified per HPI.  Vitals:   Vitals:   11/20/20 1422  BP: (!) 156/100  Pulse: (!) 59  Temp: (!) 97 F (36.1 C)  TempSrc: Temporal  SpO2: 96%  Weight: 294 lb (133.4 kg)  Height: 6\' 2"  (1.88 m)     Body mass index is 37.75 kg/m.   Physical Exam:    Physical Exam Vitals and nursing note reviewed.  Constitutional:      General: He is not in acute distress.    Appearance: He is well-developed. He is not ill-appearing, toxic-appearing or sickly-appearing.  Cardiovascular:     Rate and Rhythm: Normal rate and regular rhythm.     Pulses: Normal pulses.     Heart sounds: Normal heart sounds, S1 normal and S2 normal.     Comments: No LE edema Pulmonary:     Effort: Pulmonary effort is normal.     Breath sounds: Normal breath sounds.  Skin:     General: Skin is warm, dry and intact.     Comments: Bilateral UE scattered erythematous lesions with scabbing  Neurological:     Mental Status: He is alert.     GCS: GCS eye subscore is 4. GCS verbal subscore is 5. GCS motor subscore is 6.  Psychiatric:        Mood and Affect: Mood and affect normal.        Speech: Speech normal.        Behavior: Behavior normal. Behavior is cooperative.      Assessment and Plan:    Jaymond was seen today for hypertension and rash.  Diagnoses and all orders for this visit:  Essential hypertension Uncontrolled. Noncompliant with medication. Encouraged compliance. Refill medications today and continue: Valsartan 160 mg BID, Propranolol ER 160 mg daily, Hydralazine 50 mg TID. Follow-up in 3-9 months, based on blood work results. Declined cardiology referral. Denies exertional chest pain symptoms or severe SOB -- worsening precautions were advised. -     CBC with Differential/Platelet -     Comprehensive metabolic panel  Encounter for lipid screening for cardiovascular disease Update lipid panel and recalculate ASCVD and advise. -     Lipid panel  Obesity, unspecified classification, unspecified obesity type, unspecified whether serious comorbidity present Update Hgba1c. Encouraged healthy diet and exercise as able. -     Hemoglobin A1c  Rash Unclear etiology. Possible scabies vs dermatitis vs infection. Trial permethrin per orders as well as doxycycline. Follow-up if any worsening symptoms or lack of improvement.  Other orders -     permethrin (ELIMITE) 5 % cream; Apply to entire body; leave on for 8 to 14 hours before washing off with water. May repeat this regimen daily for 7 days, and then twice weekly until symptoms have resolved. -     doxycycline (VIBRA-TABS) 100 MG tablet; Take 1 tablet (  100 mg total) by mouth 2 (two) times daily. -     valsartan (DIOVAN) 160 MG tablet; Take 1 tablet (160 mg total) by mouth 2 (two) times daily. -      propranolol ER (INDERAL LA) 160 MG SR capsule; Take 1 capsule (160 mg total) by mouth every morning. -     hydrALAZINE (APRESOLINE) 50 MG tablet; Take 1 tablet (50 mg total) by mouth 3 (three) times daily.   CMA or LPN served as scribe during this visit. History, Physical, and Plan performed by medical provider. The above documentation has been reviewed and is accurate and complete.   Jarold Motto, PA-C [place] [place] 11/20/2020  Follow-up: No follow-ups on file.

## 2020-11-20 NOTE — Patient Instructions (Signed)
It was great to see you!  Start permethrin cream for rash as well as oral doxycyline antibiotic. For the cream:  Apply to entire body; leave on for 8 to 14 hours before washing off with water. Repeat this regimen daily for 7 days, and then twice weekly until symptoms have resolved.   Come see me if no improvement after this treatment.  Update blood work today. I will decide after blood work how soon to follow-up.  Take care,  Jarold Motto PA-C

## 2020-11-21 ENCOUNTER — Ambulatory Visit: Payer: Medicare HMO | Admitting: Physical Therapy

## 2020-11-21 LAB — CBC WITH DIFFERENTIAL/PLATELET
Basophils Absolute: 0.1 10*3/uL (ref 0.0–0.1)
Basophils Relative: 1.1 % (ref 0.0–3.0)
Eosinophils Absolute: 0.2 10*3/uL (ref 0.0–0.7)
Eosinophils Relative: 3.9 % (ref 0.0–5.0)
HCT: 40.9 % (ref 39.0–52.0)
Hemoglobin: 13.9 g/dL (ref 13.0–17.0)
Lymphocytes Relative: 19.7 % (ref 12.0–46.0)
Lymphs Abs: 1 10*3/uL (ref 0.7–4.0)
MCHC: 34.1 g/dL (ref 30.0–36.0)
MCV: 91 fl (ref 78.0–100.0)
Monocytes Absolute: 0.7 10*3/uL (ref 0.1–1.0)
Monocytes Relative: 13.3 % — ABNORMAL HIGH (ref 3.0–12.0)
Neutro Abs: 3.3 10*3/uL (ref 1.4–7.7)
Neutrophils Relative %: 62 % (ref 43.0–77.0)
Platelets: 239 10*3/uL (ref 150.0–400.0)
RBC: 4.49 Mil/uL (ref 4.22–5.81)
RDW: 13.2 % (ref 11.5–15.5)
WBC: 5.2 10*3/uL (ref 4.0–10.5)

## 2020-11-21 LAB — COMPREHENSIVE METABOLIC PANEL
ALT: 8 U/L (ref 0–53)
AST: 14 U/L (ref 0–37)
Albumin: 4.2 g/dL (ref 3.5–5.2)
Alkaline Phosphatase: 88 U/L (ref 39–117)
BUN: 15 mg/dL (ref 6–23)
CO2: 30 mEq/L (ref 19–32)
Calcium: 8.9 mg/dL (ref 8.4–10.5)
Chloride: 100 mEq/L (ref 96–112)
Creatinine, Ser: 1 mg/dL (ref 0.40–1.50)
GFR: 80.81 mL/min (ref >60.00)
Glucose, Bld: 99 mg/dL (ref 70–99)
Potassium: 4.9 mEq/L (ref 3.5–5.1)
Sodium: 136 mEq/L (ref 135–145)
Total Bilirubin: 0.5 mg/dL (ref 0.2–1.2)
Total Protein: 7.2 g/dL (ref 6.0–8.3)

## 2020-11-21 LAB — LIPID PANEL
Cholesterol: 208 mg/dL — ABNORMAL HIGH (ref 0–200)
HDL: 35.7 mg/dL — ABNORMAL LOW (ref >39.00)
NonHDL: 171.82
Total CHOL/HDL Ratio: 6
Triglycerides: 253 mg/dL — ABNORMAL HIGH (ref 0.0–149.0)
VLDL: 50.6 mg/dL — ABNORMAL HIGH (ref 0.0–40.0)

## 2020-11-21 LAB — HEMOGLOBIN A1C: Hgb A1c MFr Bld: 5.9 % (ref 4.6–6.5)

## 2020-11-21 LAB — LDL CHOLESTEROL, DIRECT: Direct LDL: 119 mg/dL

## 2020-11-25 ENCOUNTER — Ambulatory Visit: Payer: Medicare HMO | Admitting: Internal Medicine

## 2020-12-26 ENCOUNTER — Encounter: Payer: Self-pay | Admitting: Physician Assistant

## 2021-01-14 DIAGNOSIS — G47419 Narcolepsy without cataplexy: Secondary | ICD-10-CM | POA: Diagnosis not present

## 2021-01-14 DIAGNOSIS — G4733 Obstructive sleep apnea (adult) (pediatric): Secondary | ICD-10-CM | POA: Diagnosis not present

## 2021-01-14 DIAGNOSIS — Z9989 Dependence on other enabling machines and devices: Secondary | ICD-10-CM | POA: Diagnosis not present

## 2021-01-17 DIAGNOSIS — Z125 Encounter for screening for malignant neoplasm of prostate: Secondary | ICD-10-CM | POA: Diagnosis not present

## 2021-01-17 DIAGNOSIS — N401 Enlarged prostate with lower urinary tract symptoms: Secondary | ICD-10-CM | POA: Diagnosis not present

## 2021-01-17 DIAGNOSIS — R3912 Poor urinary stream: Secondary | ICD-10-CM | POA: Diagnosis not present

## 2021-01-21 ENCOUNTER — Encounter: Payer: Self-pay | Admitting: Internal Medicine

## 2021-01-21 ENCOUNTER — Ambulatory Visit (INDEPENDENT_AMBULATORY_CARE_PROVIDER_SITE_OTHER): Payer: Medicare HMO | Admitting: Internal Medicine

## 2021-01-21 VITALS — BP 152/90 | HR 72 | Ht 74.0 in | Wt 299.4 lb

## 2021-01-21 DIAGNOSIS — Z86010 Personal history of colonic polyps: Secondary | ICD-10-CM

## 2021-01-21 DIAGNOSIS — R21 Rash and other nonspecific skin eruption: Secondary | ICD-10-CM

## 2021-01-21 MED ORDER — PERMETHRIN 5 % EX CREA: TOPICAL_CREAM | CUTANEOUS | 0 refills | Status: DC

## 2021-01-21 MED ORDER — DOXYCYCLINE HYCLATE 100 MG PO TABS: 100.0000 mg | ORAL_TABLET | Freq: Two times a day (BID) | ORAL | 0 refills | Status: DC

## 2021-01-21 NOTE — Progress Notes (Signed)
Jesse Cortez 64 y.o. Nov 27, 1955 [id number]  Assessment & Plan:   Encounter Diagnoses  Name Primary?  Marland Kitchen Hx of colonic polyps Yes  . Rash and nonspecific skin eruption     We will try to obtain records but he seems reliable and with a number of polyps it makes sense to schedule a colonoscopy.The risks and benefits as well as alternatives of endoscopic procedure(s) have been discussed and reviewed. All questions answered. The patient agrees to proceed.   Since there was a treatment plan for this rash and he had not followed up, and it looks quite bothersome we will go ahead and treat as per Jarold Motto, [place] I have recommended he contact her for a follow-up appointment as he may need dermatology.  Can consider drug eruption.  We will try to obtain old records of colonoscopy with pathology reports from [facility] in [place].\ \ I appreciate the opportunity to care for this patient. CC: Jarold Motto, [place]   Subjective:   Chief Complaint: History of colon polyps  HPI 65 year old white man moved here from [place] several years ago has a history of having 2 colonoscopies with greater than 10 polyps each time.  Last one was in 2016 though we do not have records.  Some chronically loose stools but no change in bowel habits or bleeding.  He also has a history of what sounds like a modified barium swallow test where they recommended he drink through a straw and tuck his chin some.  That is not so much of a problem now.  Omeprazole controls heartburn issues.  Other problems include a skin rash which is itchy and irritating.  He was prescribed doxycycline and permethrin in January by Jarold Motto his primary care provider but there was some problem with the prescriptions and he did not get them and he never called to straighten it out so he has not taken them.  There was a question of whether this could be scabies versus staph infection.  No new drugs or  detergent etc.   Allergies  Allergen Reactions  . Bee Venom Anaphylaxis  . Naproxen Anaphylaxis  . Penicillin G Anaphylaxis  . Drug Ingredient [Monosodium Glutamate] Other (See Comments)    Headache  . Primidone     Other reaction(s): Unknown  . Topiramate Other (See Comments)    "Petit mal seizures"    Current Meds  Medication Sig  . aspirin 81 MG chewable tablet Chew by mouth.  . carbamazepine (TEGRETOL XR) 200 MG 12 hr tablet Take 200 mg by mouth 2 (two) times daily.  Marland Kitchen doxazosin (CARDURA) 2 MG tablet Take by mouth.  . hydrALAZINE (APRESOLINE) 50 MG tablet Take 1 tablet (50 mg total) by mouth 3 (three) times daily.  . hydrOXYzine (ATARAX/VISTARIL) 25 MG tablet Take one 25 mg tablet nightly  . modafinil (PROVIGIL) 200 MG tablet Take by mouth.  Marland Kitchen omeprazole (PRILOSEC) 20 MG capsule Take 20 mg by mouth daily.  . pregabalin (LYRICA) 225 MG capsule Take 225 mg by mouth 2 (two) times daily.  . propranolol (INDERAL) 20 MG tablet Take 20 mg by mouth as needed.  . propranolol ER (INDERAL LA) 160 MG SR capsule Take 1 capsule (160 mg total) by mouth every morning.  . sildenafil (REVATIO) 20 MG tablet TAKE 2 TABLETS BY MOUTH ONCE DAILY AS NEEDED 1  HOUR  BEFORE  INTERCOURSE  . valsartan (DIOVAN) 160 MG tablet Take 1 tablet (160 mg total) by mouth 2 (two)  times daily.  Marland Kitchen venlafaxine (EFFEXOR) 100 MG tablet Take 1 tablet (100 mg total) by mouth 2 (two) times daily.   Past Medical History:  Diagnosis Date  . Allergy   . Anxiety   . Arthritis   . Blood transfusion without reported diagnosis 1968  . Chronic kidney disease    kidney stones  . COPD (chronic obstructive pulmonary disease) (HCC)   . Depression   . GERD (gastroesophageal reflux disease)   . Hyperlipidemia   . Hypertension   . Neuromuscular disorder (HCC)    neuropathy  . Sleep apnea    Past Surgical History:  Procedure Laterality Date  . 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 COLONOSCOPY    . KNEE SURGERY  Left 1987, 2013  . SHOULDER SURGERY Right 2010   orthoscopy  . TONSILLECTOMY  1964  . VASECTOMY     Social History   Social History Narrative   Lives with daughter   History of law enforcement and investigation   family history includes COPD in his mother; Dementia in his mother; Diabetes in his father and mother; Heart disease in his mother.   Review of Systems   Objective:   Physical Exam BP (!) 152/90 (BP Location: Left Arm, Patient Position: Sitting)   Pulse 72   Ht 6\' 2"  (1.88 m)   Wt 299 lb 6.4 oz (135.8 kg)   SpO2 96%   BMI 38.44 kg/m  Heavyset obese wm NAD Lungs cta Cor NL s1s2 no rmg abd obese is soft and NT slin rash on extremities - superficial ulcers manu

## 2021-01-21 NOTE — Patient Instructions (Addendum)
You have been scheduled for a colonoscopy. Please follow written instructions given to you at your visit today.  Please pick up your prep supplies at the pharmacy within the next 1-3 days. If you use inhalers (even only as needed), please bring them with you on the day of your procedure.  Due to recent changes in healthcare laws, you may see the results of your imaging and laboratory studies on MyChart before your provider has had a chance to review them.  We understand that in some cases there may be results that are confusing or concerning to you. Not all laboratory results come back in the same time frame and the provider may be waiting for multiple results in order to interpret others.  Please give [place] 48 hours in order for your provider to thoroughly review all the results before contacting the office for clarification of your results.   We have sent the following medications to your pharmacy for you to pick up at your convenience: Doxycycline, permethrin  I appreciate the opportunity to care for you. Stan Head, MD, [facility]

## 2021-01-29 ENCOUNTER — Encounter: Payer: Self-pay | Admitting: Physician Assistant

## 2021-01-29 NOTE — Telephone Encounter (Signed)
Please see message, looks like Dr. Leone Payor reordered the medications Lelon Mast ordered in Jan for pt. Please advise further recommendations or should pt schedule an appointment.

## 2021-01-30 ENCOUNTER — Ambulatory Visit (INDEPENDENT_AMBULATORY_CARE_PROVIDER_SITE_OTHER): Payer: Medicare HMO

## 2021-01-30 VITALS — BP 119/76 | HR 67 | Temp 98.0°F | Wt 297.4 lb

## 2021-01-30 DIAGNOSIS — Z0000 Encounter for general adult medical examination without abnormal findings: Secondary | ICD-10-CM

## 2021-01-30 NOTE — Progress Notes (Signed)
Subjective:   Jesse Cortez is a 65 y.o. male who presents for an Initial Medicare Annual Wellness Visit.  Review of Systems     Cardiac Risk Factors include: advanced age (>39men, >33 women);obesity (BMI >30kg/m2);dyslipidemia;hypertension;male gender;smoking/ tobacco exposure (vaping)     Objective:    Today's Vitals   01/30/21 1453 01/30/21 1457  BP: 119/76   Pulse: 67   Temp: 98 F (36.7 C)   SpO2: 98%   Weight: 297 lb 6.4 oz (134.9 kg)   PainSc:  6    Body mass index is 38.18 kg/m.  Advanced Directives 01/30/2021 10/09/2020  Does Patient Have a Medical Advance Directive? Yes No  Type of Estate agent of [place];Living will -  Copy of Healthcare Power of Attorney in Chart? No - copy requested -  Would patient like information on creating a medical advance directive? - No - Patient declined    Current Medications (verified) Outpatient Encounter Medications as of 01/30/2021  Medication Sig  . aspirin 81 MG chewable tablet Chew by mouth.  . carbamazepine (TEGRETOL XR) 200 MG 12 hr tablet Take 200 mg by mouth 2 (two) times daily.  Marland Kitchen doxazosin (CARDURA) 2 MG tablet Take by mouth.  . doxycycline (VIBRA-TABS) 100 MG tablet Take 1 tablet (100 mg total) by mouth 2 (two) times daily.  . hydrALAZINE (APRESOLINE) 50 MG tablet Take 1 tablet (50 mg total) by mouth 3 (three) times daily.  . hydrOXYzine (ATARAX/VISTARIL) 25 MG tablet Take one 25 mg tablet nightly  . modafinil (PROVIGIL) 200 MG tablet Take by mouth.  Marland Kitchen omeprazole (PRILOSEC) 20 MG capsule Take 20 mg by mouth daily.  . permethrin (ELIMITE) 5 % cream Apply to entire body; leave on for 8 to 14 hours before washing off with water. May repeat this regimen daily for 7 days, and then twice weekly until symptoms have resolved.  . pregabalin (LYRICA) 225 MG capsule Take 225 mg by mouth 2 (two) times daily.  . propranolol (INDERAL) 20 MG tablet Take 20 mg by mouth as needed.  . propranolol ER  (INDERAL LA) 160 MG SR capsule Take 1 capsule (160 mg total) by mouth every morning.  . sildenafil (REVATIO) 20 MG tablet TAKE 2 TABLETS BY MOUTH ONCE DAILY AS NEEDED 1  HOUR  BEFORE  INTERCOURSE  . valsartan (DIOVAN) 160 MG tablet Take 1 tablet (160 mg total) by mouth 2 (two) times daily.  Marland Kitchen venlafaxine (EFFEXOR) 100 MG tablet Take 1 tablet (100 mg total) by mouth 2 (two) times daily.   No facility-administered encounter medications on file as of 01/30/2021.    Allergies (verified) Bee venom, Naproxen, Penicillin g, Doxycycline, Drug ingredient [monosodium glutamate], Primidone, and Topiramate   History: Past Medical History:  Diagnosis Date  . Allergy   . Anxiety   . Arthritis   . Blood transfusion without reported diagnosis 1968  . Chronic kidney disease    kidney stones  . COPD (chronic obstructive pulmonary disease) (HCC)   . Depression   . GERD (gastroesophageal reflux disease)   . Hyperlipidemia   . Hypertension   . Neuromuscular disorder (HCC)    neuropathy  . Sleep apnea    Past Surgical History:  Procedure Laterality Date  . 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 COLONOSCOPY    . KNEE SURGERY Left 1987, 2013  . SHOULDER SURGERY Right 2010   orthoscopy  . TONSILLECTOMY  1964  . VASECTOMY     Family  History  Problem Relation Age of Onset  . Heart disease Mother   . COPD Mother   . Dementia Mother   . Diabetes Mother   . Diabetes Father    Social History   Socioeconomic History  . Marital status: Single    Spouse name: Not on file  . Number of children: Not on file  . Years of education: Not on file  . Highest education level: Not on file  Occupational History  . Not on file  Tobacco Use  . Smoking status: Current Every Day Smoker    Years: 40.00    Types: E-cigarettes, Cigarettes    Start date: 08/20/1980  . Smokeless tobacco: Never Used  . Tobacco comment: up to 3PPD  Vaping Use  . Vaping Use: Every day  Substance and Sexual Activity  .  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 Drug use: No  . Sexual activity: Not Currently  Other Topics Concern  . Not on file  Social History Narrative   Lives with daughter   History of law enforcement and investigation   Social Determinants of Health   Financial Resource Strain: Low Risk   . Difficulty of Paying Living Expenses: Not hard at all  Food Insecurity: No Food Insecurity  . Worried About Programme researcher, broadcasting/film/videounning Out of Food in the Last Year: Never true  . Ran Out of Food in the Last Year: Never true  Transportation Needs: No Transportation Needs  . Lack of Transportation (Medical): No  . Lack of Transportation (Non-Medical): No  Physical Activity: Sufficiently Active  . Days of Exercise per Week: 5 days  . Minutes of Exercise per Session: 30 min  Stress: Stress Concern Present  . Feeling of Stress : To some extent  Social Connections: Socially Isolated  . Frequency of Communication with Friends and Family: Once a week  . Frequency of Social Gatherings with Friends and Family: More than three times a week  . Attends Religious Services: Never  . Active Member of Clubs or Organizations: No  . Attends BankerClub or Organization Meetings: Never  . Marital Status: Never married    Tobacco Counseling Ready to quit: Not Answered Counseling given: Not Answered Comment: up to 3PPD   Clinical Intake:  Pre-visit preparation completed: Yes  Pain : 0-10 Pain Score: 6  Pain Location: Generalized Pain Descriptors / Indicators: Pins and needles,Burning,Aching,Sharp Pain Onset: More than a month ago Pain Frequency: Constant     BMI - recorded: 38.18 Nutritional Status: BMI > 30  Obese Nutritional Risks: None Diabetes: No  How often do you need to have someone help you when you read instructions, pamphlets, or other written materials from your doctor or pharmacy?: 1 - Never  Diabetic?No  Interpreter Needed?: No  Information entered by :: Lanier Ensignina Erhard Senske, LPN   Activities of  Daily Living In your present state of health, do you have any difficulty performing the following activities: 01/30/2021 08/20/2020  Hearing? Y Y  Comment mild hearing loss -  Vision? N Y  Comment - wears glasses, cataracts  Difficulty concentrating or making decisions? N N  Walking or climbing stairs? Y Y  Dressing or bathing? N N  Doing errands, shopping? N N  Preparing Food and eating ? N -  Using the Toilet? N -  In the past six months, have you accidently leaked urine? Y -  Do you have problems with loss of bowel control? Y -  Managing your Medications? N -  Managing your Finances? N -  Housekeeping or managing your Housekeeping? N -  Some recent data might be hidden    Patient Care Team: Jarold Motto, [place] as PCP - General (Physician Assistant)  Indicate any recent Medical Services you may have received from other than [facility] providers in the past year (date may be approximate).     Assessment:   This is a routine wellness examination for [place].  Hearing/Vision screen  Hearing Screening   125Hz  250Hz  500Hz  1000Hz  2000Hz  3000Hz  4000Hz  6000Hz  8000Hz   Right ear:           Left ear:           Comments: Pt mild loss   Vision Screening Comments: Pt follows up with Dr for eye  annual exams   Dietary issues and exercise activities discussed: Current Exercise Habits: Home exercise routine, Type of exercise: walking (walkig dogs), Time (Minutes): 30, Frequency (Times/Week): 5, Weekly Exercise (Minutes/Week): 150  Goals    . Patient Stated     Lose weight       Depression Screen PHQ 2/9 Scores 01/30/2021 08/20/2020 02/02/2018  PHQ - 2 Score 1 0 0  PHQ- 9 Score - 11 -    Fall Risk Fall Risk  01/30/2021 02/02/2018  Falls in the past year? 1 Yes  Number falls in past yr: 1 2 or more  Injury with Fall? 1 -  Comment bruised -  Risk Factor Category  - High Fall Risk  Risk for fall due to : Impaired balance/gait;Impaired vision -  Follow up Falls prevention discussed  -    FALL RISK PREVENTION PERTAINING TO THE HOME:  Any stairs in or around the home? Yes  If so, are there any without handrails? No  Home free of loose throw rugs in walkways, pet beds, electrical cords, etc? Yes  Adequate lighting in your home to reduce risk of falls? Yes   ASSISTIVE DEVICES UTILIZED TO PREVENT FALLS:  Life alert? No  Use of a cane, walker or w/c? Yes  Grab bars in the bathroom? No  Shower chair or bench in shower? No  Elevated toilet seat or a handicapped toilet? No   TIMED UP AND GO:  Was the test performed? Yes .  Length of time to ambulate 10 feet: 10 sec.   Gait steady and fast without use of assistive device  Cognitive Function:     6CIT Screen 01/30/2021  What Year? 0 points  What month? 0 points  Count back from 20 0 points  Months in reverse 0 points  Repeat phrase 0 points    Immunizations Immunization History  Administered Date(s) Administered  . Influenza, Quadrivalent, Recombinant, Inj, Pf 09/16/2019  . Influenza,inj,Quad PF,6+ Mos 07/27/2018  . Influenza-Unspecified 09/15/2017  . Pneumococcal Polysaccharide-23 10/29/2016  . Tdap 11/17/2011    TDAP status: Up to date  Flu Vaccine status: Due, Education has been provided regarding the importance of this vaccine. Advised may receive this vaccine at local pharmacy or Health Dept. Aware to provide a copy of the vaccination record if obtained from local pharmacy or Health Dept. Verbalized acceptance and understanding.  Pneumococcal vaccine status: Declined,  Education has been provided regarding the importance of this vaccine but patient still declined. Advised may receive this vaccine at local pharmacy or Health Dept. Aware to provide a copy of the vaccination record if obtained from local pharmacy or Health Dept. Verbalized acceptance and understanding.   Covid-19 vaccine status: Declined, Education has been provided  regarding the importance of this vaccine but patient still declined.  Advised may receive this vaccine at local pharmacy or Health Dept.or vaccine clinic. Aware to provide a copy of the vaccination record if obtained from local pharmacy or Health Dept. Verbalized acceptance and understanding.  Qualifies for Shingles Vaccine? Yes   Zostavax completed No   Shingrix Completed?: No.    Education has been provided regarding the importance of this vaccine. Patient has been advised to call insurance company to determine out of pocket expense if they have not yet received this vaccine. Advised may also receive vaccine at local pharmacy or Health Dept. Verbalized acceptance and understanding.  Screening Tests Health Maintenance  Topic Date Due  . Hepatitis C Screening  Never done  . HIV Screening  Never done  . COLONOSCOPY (Pts 45-48yrs Insurance coverage will need to be confirmed)  Never done  . INFLUENZA VACCINE  02/13/2021 (Originally 06/16/2020)  . TETANUS/TDAP  11/16/2021  . HPV VACCINES  Aged Out  . COVID-19 Vaccine  Discontinued    Health Maintenance  Health Maintenance Due  Topic Date Due  . Hepatitis C Screening  Never done  . HIV Screening  Never done  . COLONOSCOPY (Pts 45-12yrs Insurance coverage will need to be confirmed)  Never done    Colorectal cancer 03/19/21 scheduled    Additional Screening:  Hepatitis C Screening: does qualify;   Vision Screening: Recommended annual ophthalmology exams for early detection of glaucoma and other disorders of the eye. Is the patient up to date with their annual eye exam?  Yes  Who is the provider or what is the name of the office in which the patient attends annual eye exams? Dr Daisey Must  If pt is not established with a provider, would they like to be referred to a provider to establish care? No .   Dental Screening: Recommended annual dental exams for proper oral hygiene  Community Resource Referral / Chronic Care Management: CRR required this visit?  No   CCM required this visit?  No      Plan:      I have personally reviewed and noted the following in the patient's chart:   . Medical and social history . Use of alcohol, tobacco or illicit drugs  . Current medications and supplements . Functional ability and status . Nutritional status . Physical activity . Advanced directives . List of other physicians . Hospitalizations, surgeries, and ER visits in previous 12 months . Vitals . Screenings to include cognitive, depression, and falls . Referrals and appointments  In addition, I have reviewed and discussed with patient certain preventive protocols, quality metrics, and best practice recommendations. A written personalized care plan for preventive services as well as general preventive health recommendations were provided to patient.     Marzella Schlein, LPN   9/38/1829   Nurse Notes:None

## 2021-01-30 NOTE — Patient Instructions (Addendum)
Jesse Cortez , Thank you for taking time to come for your Medicare Wellness Visit. I appreciate your ongoing commitment to your health goals. Please review the following plan we discussed and let me know if I can assist you in the future.   Screening recommendations/referrals: Colonoscopy: Ordered for 03/19/21 Recommended yearly ophthalmology/optometry visit for glaucoma screening and checkup Recommended yearly dental visit for hygiene and checkup  Vaccinations: Influenza vaccine: Up to date Pneumococcal vaccine: Up to date Tdap vaccine: Up to date Shingles vaccine: Shingrix discussed. Please contact your pharmacy for coverage information.    Covid-19: Declined and discussed  Advanced directives: Please bring a copy of your health care power of attorney and living will to the office at your convenience.  Conditions/risks identified: Lose weight   Next appointment: Follow up in one year for your annual wellness visit   Preventive Care 40-64 Years, Male Preventive care refers to lifestyle choices and visits with your health care provider that can promote health and wellness. What does preventive care include?  A yearly physical exam. This is also called an annual well check.  Dental exams once or twice a year.  Routine eye exams. Ask your health care provider how often you should have your eyes checked.  Personal lifestyle choices, including:  Daily care of your teeth and gums.  Regular physical activity.  Eating a healthy diet.  Avoiding tobacco and drug use.  Limiting alcohol use.  Practicing safe sex.  Taking low-dose aspirin every day starting at age 24. What happens during an annual well check? The services and screenings done by your health care provider during your annual well check will depend on your age, overall health, lifestyle risk factors, and family history of disease. Counseling  Your health care provider may ask you questions about your:  Alcohol  use.  Tobacco use.  Drug use.  Emotional well-being.  Home and relationship well-being.  Sexual activity.  Eating habits.  Work and work Astronomer. Screening  You may have the following tests or measurements:  Height, weight, and BMI.  Blood pressure.  Lipid and cholesterol levels. These may be checked every 5 years, or more frequently if you are over 66 years old.  Skin check.  Lung cancer screening. You may have this screening every year starting at age 40 if you have a 30-pack-year history of smoking and currently smoke or have quit within the past 15 years.  Fecal occult blood test (FOBT) of the stool. You may have this test every year starting at age 85.  Flexible sigmoidoscopy or colonoscopy. You may have a sigmoidoscopy every 5 years or a colonoscopy every 10 years starting at age 8.  Prostate cancer screening. Recommendations will vary depending on your family history and other risks.  Hepatitis C blood test.  Hepatitis B blood test.  Sexually transmitted disease (STD) testing.  Diabetes screening. This is done by checking your blood sugar (glucose) after you have not eaten for a while (fasting). You may have this done every 1-3 years. Discuss your test results, treatment options, and if necessary, the need for more tests with your health care provider. Vaccines  Your health care provider may recommend certain vaccines, such as:  Influenza vaccine. This is recommended every year.  Tetanus, diphtheria, and acellular pertussis (Tdap, Td) vaccine. You may need a Td booster every 10 years.  Zoster vaccine. You may need this after age 58.  Pneumococcal 13-valent conjugate (PCV13) vaccine. You may need this if you have certain conditions  and have not been vaccinated.  Pneumococcal polysaccharide (PPSV23) vaccine. You may need one or two doses if you smoke cigarettes or if you have certain conditions. Talk to your health care provider about which screenings  and vaccines you need and how often you need them. This information is not intended to replace advice given to you by your health care provider. Make sure you discuss any questions you have with your health care provider. Document Released: 11/29/2015 Document Revised: 07/22/2016 Document Reviewed: 09/03/2015 Elsevier Interactive Patient Education  2017 [employer].  Fall Prevention in the Home Falls can cause injuries. They can happen to people of all ages. There are many things you can do to make your home safe and to help prevent falls. What can I do on the outside of my home?  Regularly fix the edges of walkways and driveways and fix any cracks.  Remove anything that might make you trip as you walk through a door, such as a raised step or threshold.  Trim any bushes or trees on the path to your home.  Use bright outdoor lighting.  Clear any walking paths of anything that might make someone trip, such as rocks or tools.  Regularly check to see if handrails are loose or broken. Make sure that both sides of any steps have handrails.  Any raised decks and porches should have guardrails on the edges.  Have any leaves, snow, or ice cleared regularly.  Use sand or salt on walking paths during winter.  Clean up any spills in your garage right away. This includes oil or grease spills. What can I do in the bathroom?  Use night lights.  Install grab bars by the toilet and in the tub and shower. Do not use towel bars as grab bars.  Use non-skid mats or decals in the tub or shower.  If you need to sit down in the shower, use a plastic, non-slip stool.  Keep the floor dry. Clean up any water that spills on the floor as soon as it happens.  Remove soap buildup in the tub or shower regularly.  Attach bath mats securely with double-sided non-slip rug tape.  Do not have throw rugs and other things on the floor that can make you trip. What can I do in the bedroom?  Use night  lights.  Make sure that you have a light by your bed that is easy to reach.  Do not use any sheets or blankets that are too big for your bed. They should not hang down onto the floor.  Have a firm chair that has side arms. You can use this for support while you get dressed.  Do not have throw rugs and other things on the floor that can make you trip. What can I do in the kitchen?  Clean up any spills right away.  Avoid walking on wet floors.  Keep items that you use a lot in easy-to-reach places.  If you need to reach something above you, use a strong step stool that has a grab bar.  Keep electrical cords out of the way.  Do not use floor polish or wax that makes floors slippery. If you must use wax, use non-skid floor wax.  Do not have throw rugs and other things on the floor that can make you trip. What can I do with my stairs?  Do not leave any items on the stairs.  Make sure that there are handrails on both sides of the stairs  and use them. Fix handrails that are broken or loose. Make sure that handrails are as long as the stairways.  Check any carpeting to make sure that it is firmly attached to the stairs. Fix any carpet that is loose or worn.  Avoid having throw rugs at the top or bottom of the stairs. If you do have throw rugs, attach them to the floor with carpet tape.  Make sure that you have a light switch at the top of the stairs and the bottom of the stairs. If you do not have them, ask someone to add them for you. What else can I do to help prevent falls?  Wear shoes that:  Do not have high heels.  Have rubber bottoms.  Are comfortable and fit you well.  Are closed at the toe. Do not wear sandals.  If you use a stepladder:  Make sure that it is fully opened. Do not climb a closed stepladder.  Make sure that both sides of the stepladder are locked into place.  Ask someone to hold it for you, if possible.  Clearly mark and make sure that you can  see:  Any grab bars or handrails.  First and last steps.  Where the edge of each step is.  Use tools that help you move around (mobility aids) if they are needed. These include:  Canes.  Walkers.  Scooters.  Crutches.  Turn on the lights when you go into a dark area. Replace any light bulbs as soon as they burn out.  Set up your furniture so you have a clear path. Avoid moving your furniture around.  If any of your floors are uneven, fix them.  If there are any pets around you, be aware of where they are.  Review your medicines with your doctor. Some medicines can make you feel dizzy. This can increase your chance of falling. Ask your doctor what other things that you can do to help prevent falls. This information is not intended to replace advice given to you by your health care provider. Make sure you discuss any questions you have with your health care provider. Document Released: 08/29/2009 Document Revised: 04/09/2016 Document Reviewed: 12/07/2014 Elsevier Interactive Patient Education  2017 [employer].

## 2021-03-19 ENCOUNTER — Encounter: Payer: Medicare HMO | Admitting: Internal Medicine

## 2021-05-28 ENCOUNTER — Telehealth: Payer: Self-pay

## 2021-05-28 NOTE — Telephone Encounter (Signed)
Nurse Assessment Nurse: Ferol Luz, RN, Aurther Loft Date/Time (Eastern Time): 05/27/2021 5:19:54 PM Confirm and document reason for call. If symptomatic, describe symptoms. ---Caller states having abd pain and and pale looking stools. Onset of abd pain 3 weeks ago. Having difficulty w/ change in body position. Had diarrhea 3 weeks ago and taking daily anti-diarrheal, Imodium, since. C/o intermittent clay colored stools. Stated has open sores appearing 6 mos. ago. Decrease in appetite. Does the patient have any new or worsening symptoms? ---Yes Will a triage be completed? ---Yes Related visit to physician within the last 2 weeks? ---No Does the PT have any chronic conditions? (i.e. diabetes, asthma, this includes High risk factors for pregnancy, etc.) ---Yes List chronic conditions. ---idiopathic polyneuropathy, PTSD, HTN. Is this a behavioral health or substance abuse call? ---No Guidelines Guideline Title Affirmed Question Affirmed Notes Nurse Date/Time (Eastern Time) Abdominal Pain - Male [1] MILDMODERATE pain AND [2] constant AND [3] present > 2 hours Bogard, RN, Aurther Loft 05/27/2021 5:27:21 PM PLEASE NOTE: All timestamps contained within this report are represented as [place] Standard Time. CONFIDENTIALTY NOTICE: This fax transmission is intended only for the addressee. It contains information that is legally privileged, confidential or otherwise protected from use or disclosure. If you are not the intended recipient, you are strictly prohibited from reviewing, disclosing, copying using or disseminating any of this information or taking any action in reliance on or regarding this information. If you have received this fax in error, please notify us immediately by telephone so that we can arrange for its return to [place]. Phone: [phone], Toll-Free: [phone], Fax: [phone] Page: 2 of 2 Call Id: [id number] Disp. Time Jesse Cortez Time) Disposition Final User 05/27/2021 4:26:56 PM Attempt  made - message left Bogard, RN, Aurther Loft 05/27/2021 4:49:30 PM Attempt made - no message left Bogard, RN, Aurther Loft 05/27/2021 5:31:31 PM See HCP within 4 Hours (or PCP triage) Yes Bogard, RN, [name] Disagree/Comply Disagree Caller Understands Yes PreDisposition Did not know what to do Care Advice Given Per Guideline * IF OFFICE WILL BE OPEN: You need to be seen within the next 3 or 4 hours. Call your doctor (or NP/PA) now or as soon as the office opens. * UCC: Some UCCs can manage patients who are stable and have less serious symptoms (e.g., minor illnesses and injuries). The triager must know the [facility] capabilities before sending a patient there. If unsure, call ahead. * ED: Patients who may need surgery or hospital admission need to be sent to an ED. So do most patients with serious symptoms or complex medical problems. NOTHING BY MOUTH: * Do not eat or drink anything for now. CALL BACK IF: * You become worse CARE ADVICE given per Abdominal Pain, Male (Adult) guideline. Referrals GO TO FACILITY REFUSED

## 2021-05-28 NOTE — Telephone Encounter (Signed)
Danielle, please call pt and schedule appointment for pt to be seen today by a provider.

## 2021-05-28 NOTE — Telephone Encounter (Signed)
LVM to call back.

## 2021-05-30 ENCOUNTER — Ambulatory Visit (INDEPENDENT_AMBULATORY_CARE_PROVIDER_SITE_OTHER): Payer: Medicare HMO | Admitting: Registered Nurse

## 2021-05-30 ENCOUNTER — Encounter: Payer: Self-pay | Admitting: Registered Nurse

## 2021-05-30 ENCOUNTER — Other Ambulatory Visit: Payer: Self-pay

## 2021-05-30 VITALS — BP 128/84 | HR 80 | Temp 98.4°F | Resp 18 | Ht 74.0 in | Wt 283.4 lb

## 2021-05-30 DIAGNOSIS — L282 Other prurigo: Secondary | ICD-10-CM

## 2021-05-30 DIAGNOSIS — S61452A Open bite of left hand, initial encounter: Secondary | ICD-10-CM

## 2021-05-30 DIAGNOSIS — R1011 Right upper quadrant pain: Secondary | ICD-10-CM | POA: Diagnosis not present

## 2021-05-30 DIAGNOSIS — R195 Other fecal abnormalities: Secondary | ICD-10-CM | POA: Diagnosis not present

## 2021-05-30 DIAGNOSIS — K529 Noninfective gastroenteritis and colitis, unspecified: Secondary | ICD-10-CM

## 2021-05-30 DIAGNOSIS — W5501XA Bitten by cat, initial encounter: Secondary | ICD-10-CM

## 2021-05-30 MED ORDER — CLOBETASOL PROPIONATE 0.05 % EX OINT: 1.0000 "application " | TOPICAL_OINTMENT | Freq: Two times a day (BID) | CUTANEOUS | 0 refills | Status: DC

## 2021-05-30 MED ORDER — SULFAMETHOXAZOLE-TRIMETHOPRIM 800-160 MG PO TABS: 1.0000 | ORAL_TABLET | Freq: Two times a day (BID) | ORAL | 0 refills | Status: DC

## 2021-05-30 MED ORDER — METRONIDAZOLE 500 MG PO TABS: 500.0000 mg | ORAL_TABLET | Freq: Three times a day (TID) | ORAL | 0 refills | Status: DC

## 2021-05-30 NOTE — Progress Notes (Signed)
Acute Office Visit  Subjective:    Patient ID: Jesse Cortez, male    DOB: 1956/04/03, 65 y.o.   MRN: [id number]  Chief Complaint  Patient presents with   Sore    Patient states he has sores all over his arms that will not go away. Patient states the bumps will get yellow pus coming out. He also got bit by a cat on his left hand 2 days ago and some abdominal pain.    HPI Patient is in today for a few concerns:  Rash on arms Has been ongoing for a few months - since January. Seen by his PCP Jarold Motto at [facility] office - was given doxycycline, notes itching and couldn't quite finish course. Unfortunately did not see improvement. Has tried some OTC topicals but no effect Rash is very itchy, nodular, and at times has pus that drains from lesions. Only on forearms and distal portion of upper arms. None on trunk or legs. Has not happened before. Lives with daughter who has no similar symptoms. No hx of autoimmune processes. 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   Abdominal pain Upper right quadrant. Ongoing for some time, but worse lately. Notes consistent loose stool but particularly watery past 2-3 weeks. At times very pale, near white, which has happened to him once before. After hepatobiliary work up, [place] URQ showed thickening of gallbladder that bordered on abnormal. Otherwise studies were reassuring. Notes that loose stools steady despite any diet restrictions he has tried. No pain with BM, pain not relieved by BM. No nv. No lightheadedness, dizziness, shob. Notes he has lost around 9 lbs in past 2-3 mo.  Cat bite Was brushing one of his cats who unfortunately is dealing with a flea infestation. This is new since his rash started and does not think his rash is related. Unfortunately the cat did not take kindly to the brushing and bit hard on his left hand, puncturing the skin in two places. He cleaned this with water and isopropyl alcohol. No pus or drainage. No lymph  inflammation in arm, axilla, or L supraclavicular chain. No systemic symptoms suggestive of infection, sepsis, SIRS.   Tinnitus Ongoing, worse lately. Wants to ensure there is no inner ear abnormality, infestation, or other acute concern.   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   COLONOSCOPY     KNEE SURGERY Left 1987, 2013   SHOULDER SURGERY Right 2010   orthoscopy   TONSILLECTOMY  1964   VASECTOMY      Family History  Problem Relation Age of Onset   Heart disease Mother    COPD Mother    Dementia Mother    Diabetes Mother    Diabetes Father     Social History   Socioeconomic History   Marital status: Single    Spouse name: Not on file   Number of children: Not on file   Years of education: Not on file  Highest education level: Not on file  Occupational History   Not on file  Tobacco Use   Smoking status: Every Day    Years: 40.00    Types: E-cigarettes, Cigarettes    Start date: 08/20/1980   Smokeless tobacco: Never   Tobacco comments:    up to 3PPD  Vaping Use   Vaping Use: Every day  Substance and Sexual Activity   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   Drug use: No   Sexual activity: Not Currently  Other Topics Concern   Not on file  Social History Narrative   Lives with daughter   History of law enforcement and investigation   Social Determinants of Health   Financial Resource Strain: Low Risk    Difficulty of Paying Living Expenses: Not hard at all  Food Insecurity: No Food Insecurity   Worried About  Programme researcher, broadcasting/film/videounning Out of Food in the Last Year: Never true   Ran Out of Food in the Last Year: Never true  Transportation Needs: No Transportation Needs   Lack of Transportation (Medical): No   Lack of Transportation (Non-Medical): No  Physical Activity: Sufficiently Active   Days of Exercise per Week: 5 days   Minutes of Exercise per Session: 30 min  Stress: Stress Concern Present   Feeling of Stress : To some extent  Social Connections: Socially Isolated   Frequency of Communication with Friends and Family: Once a week   Frequency of Social Gatherings with Friends and Family: More than three times a week   Attends Religious Services: Never   Database administratorActive Member of Clubs or Organizations: No   Attends Engineer, structuralClub or Organization Meetings: Never   Marital Status: Never married  Catering managerntimate Partner Violence: Not At Risk   Fear of Current or Ex-Partner: No   Emotionally Abused: No   Physically Abused: No   Sexually Abused: No    Outpatient Medications Prior to Visit  Medication Sig Dispense Refill   aspirin 81 MG chewable tablet Chew by mouth.     carbamazepine (TEGRETOL XR) 200 MG 12 hr tablet Take 200 mg by mouth 2 (two) times daily.     doxazosin (CARDURA) 2 MG tablet Take by mouth.     hydrOXYzine (ATARAX/VISTARIL) 25 MG tablet Take one 25 mg tablet nightly 90 tablet 1   modafinil (PROVIGIL) 200 MG tablet Take by mouth.     omeprazole (PRILOSEC) 20 MG capsule Take 20 mg by mouth daily.     permethrin (ELIMITE) 5 % cream Apply to entire body; leave on for 8 to 14 hours before washing off with water. May repeat this regimen daily for 7 days, and then twice weekly until symptoms have resolved. 60 g 0   pregabalin (LYRICA) 225 MG capsule Take 225 mg by mouth 2 (two) times daily.     propranolol (INDERAL) 20 MG tablet Take 20 mg by mouth as needed.     propranolol ER (INDERAL LA) 160 MG SR capsule Take 1 capsule (160 mg total) by mouth every morning. 90 capsule 2   sildenafil (REVATIO) 20 MG tablet TAKE 2  TABLETS BY MOUTH ONCE DAILY AS NEEDED 1  HOUR  BEFORE  INTERCOURSE     valsartan (DIOVAN) 160 MG tablet Take 1 tablet (160 mg total) by mouth 2 (two) times daily. 180 tablet 3   venlafaxine (EFFEXOR) 100 MG tablet Take 1 tablet (100 mg total) by mouth 2 (two) times daily. 180 tablet 1   doxycycline (VIBRA-TABS) 100  MG tablet Take 1 tablet (100 mg total) by mouth 2 (two) times daily. 20 tablet 0   hydrALAZINE (APRESOLINE) 50 MG tablet Take 1 tablet (50 mg total) by mouth 3 (three) times daily. 270 tablet 3   No facility-administered medications prior to visit.    Allergies  Allergen Reactions   Bee Venom Anaphylaxis   Naproxen Anaphylaxis   Penicillin G Anaphylaxis   Doxycycline     Rash and itching    Drug Ingredient [Monosodium Glutamate] Other (See Comments)    Headache   Primidone     Other reaction(s): Unknown   Topiramate Other (See Comments)    "Petit mal seizures"     Review of Systems Per hpi      Objective:    Physical Exam Vitals and nursing note reviewed.  Constitutional:      Appearance: Normal appearance.  HENT:     Right Ear: Tympanic membrane, ear canal and external ear normal. There is no impacted cerumen.     Left Ear: Tympanic membrane, ear canal and external ear normal. There is no impacted cerumen.  Cardiovascular:     Rate and Rhythm: Normal rate and regular rhythm.     Pulses: Normal pulses.     Heart sounds: Normal heart sounds. No murmur heard.   No friction rub. No gallop.  Pulmonary:     Effort: Pulmonary effort is normal. No respiratory distress.     Breath sounds: Normal breath sounds. No stridor. No wheezing, rhonchi or rales.  Abdominal:     General: Abdomen is flat. Bowel sounds are normal. There is no distension.     Palpations: There is no mass.     Tenderness: There is no abdominal tenderness. There is no guarding or rebound.     Hernia: No hernia is present.  Skin:    General: Skin is warm and dry.     Findings: Rash (nodular  rash on both forearms dorsal surface. many are open sores. some have visible pus or serosanguinous drainage. some are healing and appear scarred, seem leathery and thick) present.  Neurological:     General: No focal deficit present.     Mental Status: He is alert. Mental status is at baseline.  Psychiatric:        Mood and Affect: Mood normal.        Behavior: Behavior normal.        Thought Content: Thought content normal.        Judgment: Judgment normal.    BP 128/84   Pulse 80   Temp 98.4 F (36.9 C) (Temporal)   Resp 18   Ht 6\' 2"  (1.88 m)   Wt 283 lb 6.4 oz (128.5 kg)   SpO2 99%   BMI 36.39 kg/m  Wt Readings from Last 3 Encounters:  05/30/21 283 lb 6.4 oz (128.5 kg)  01/30/21 297 lb 6.4 oz (134.9 kg)  01/21/21 299 lb 6.4 oz (135.8 kg)    Health Maintenance Due  Topic Date Due   Hepatitis C Screening  Never done    There are no preventive care reminders to display for this patient.   No results found for: TSH Lab Results  Component Value Date   WBC 5.2 11/20/2020   HGB 13.9 11/20/2020   HCT 40.9 11/20/2020   MCV 91.0 11/20/2020   PLT 239.0 11/20/2020   Lab Results  Component Value Date   NA 136 11/20/2020   K 4.9 11/20/2020   CO2 30 11/20/2020  GLUCOSE 99 11/20/2020   BUN 15 11/20/2020   CREATININE 1.00 11/20/2020   BILITOT 0.5 11/20/2020   ALKPHOS 88 11/20/2020   AST 14 11/20/2020   ALT 8 11/20/2020   PROT 7.2 11/20/2020   ALBUMIN 4.2 11/20/2020   CALCIUM 8.9 11/20/2020   GFR 80.81 11/20/2020   Lab Results  Component Value Date   CHOL 208 (H) 11/20/2020   Lab Results  Component Value Date   HDL 35.70 (L) 11/20/2020   No results found for: [facility] Lab Results  Component Value Date   TRIG 253.0 (H) 11/20/2020   Lab Results  Component Value Date   CHOLHDL 6 11/20/2020   Lab Results  Component Value Date   HGBA1C 5.9 11/20/2020       Assessment & Plan:   Problem List Items Addressed This Visit   None Visit Diagnoses      Prurigo    -  Primary   Relevant Medications   clobetasol ointment (TEMOVATE) 0.05 %   Cat bite of hand, left, initial encounter       Relevant Medications   metroNIDAZOLE (FLAGYL) 500 MG tablet   sulfamethoxazole-trimethoprim (BACTRIM DS) 800-160 MG tablet   Chronic diarrhea       Relevant Orders   Comprehensive metabolic panel   CBC with Differential/Platelet   Hemoglobin A1c   TSH   Right upper quadrant abdominal pain       Acholic stool       Relevant Orders   Comprehensive metabolic panel   Amylase   Lipase   Protime-INR        Meds ordered this encounter  Medications   metroNIDAZOLE (FLAGYL) 500 MG tablet    Sig: Take 1 tablet (500 mg total) by mouth 3 (three) times daily.    Dispense:  15 tablet    Refill:  0    Order Specific Question:   Supervising Provider    Answer:   Neva Seat, JEFFREY R [[id number]]   sulfamethoxazole-trimethoprim (BACTRIM DS) 800-160 MG tablet    Sig: Take 1 tablet by mouth 2 (two) times daily.    Dispense:  14 tablet    Refill:  0    Order Specific Question:   Supervising Provider    Answer:   Neva Seat, JEFFREY R [[id number]]   clobetasol ointment (TEMOVATE) 0.05 %    Sig: Apply 1 application topically 2 (two) times daily. For 14 days. Repeat with recurrence / flare.    Dispense:  60 g    Refill:  0    Order Specific Question:   Supervising Provider    Answer:   Neva Seat, JEFFREY R [[id number]]   PLAN Rash: given duration and relative stability of dissemination across forearms suspect more inflammatory condition rather than infection that likely would have spread or changed at this time. Perhaps an actinic prurigo? Will treat with two weeks of topical clobetasol 0.05% bid. If no improvement will have to consider derm referral.  Animal bite: no obvious signs of infection but given fairly deep bite on hand, will treat prophylactic. Unfortunately allergic to tetracyclines. Will pursue bactrim po bid x 1 week and metronidazole tid x 5 days as above. This should  provide good coverage for any secondary infection in rash as well. Abdominal pain: labs today. Repeat RUQ u/s. Low threshold for further imaging of abdomen with CT scan w wo contrast as he has not had colon ca screening in past and has unplanned weight loss in past few weeks. Reviewed ER precautions with  patient who voices understanding. Ears show no abnormalities on exam. Continue to pursue supportive care and coping mechanisms for tinnitus.  Patient encouraged to call clinic with any questions, comments, or concerns.  I spent 48 minutes with this patient coordinating imaging, lab studies, reviewing past records, and starting 3 new medications.  Janeece Agee, NP

## 2021-05-30 NOTE — Patient Instructions (Signed)
Mr. Jerimyah Vandunk to meet you. In brief:  Cat bite: prophylaxis for infection with 5 days of metronidazole and 7 days of bactrim. Take with food. If noting further swelling in arm, nodules in elbow or arm pit, or systemic symptoms like fever, chills, fatigue, sweats, etc (symptoms of Sepsis or SIRS), seek in person care at ED as IV antibiotics may be warranted. Rash: suspecting actinic prurigo, an inflammatory process caused by exposure to sun. Intermittent courses of 14 days of clobetasol topically will help. Secondary infection possible from itching - should be covered pretty well by antibiotics listed above. Pale stool, abdominal pain: start with labs and repeat liver ultrasound. Thickened gallbladder wall noted on last study, I want to make sure there's no obstruction or mass forming in liver/gallbladder/bile duct. Will contact you with results.  Ms. Bufford Buttner will be Cc'd on this visit and results to ensure she's aware of our game plan  I'll be in touch soon  Luan Pulling

## 2021-05-31 ENCOUNTER — Telehealth: Payer: Self-pay | Admitting: Family Medicine

## 2021-05-31 ENCOUNTER — Emergency Department (HOSPITAL_BASED_OUTPATIENT_CLINIC_OR_DEPARTMENT_OTHER)
Admission: EM | Admit: 2021-05-31 | Discharge: 2021-05-31 | Disposition: A | Discharge status: Home / Self Care | Payer: Medicare HMO | Attending: Emergency Medicine | Admitting: Emergency Medicine

## 2021-05-31 ENCOUNTER — Emergency Department (HOSPITAL_BASED_OUTPATIENT_CLINIC_OR_DEPARTMENT_OTHER): Payer: Medicare HMO

## 2021-05-31 ENCOUNTER — Encounter (HOSPITAL_BASED_OUTPATIENT_CLINIC_OR_DEPARTMENT_OTHER): Payer: Self-pay | Admitting: Emergency Medicine

## 2021-05-31 DIAGNOSIS — J449 Chronic obstructive pulmonary disease, unspecified: Secondary | ICD-10-CM | POA: Insufficient documentation

## 2021-05-31 DIAGNOSIS — K828 Other specified diseases of gallbladder: Secondary | ICD-10-CM | POA: Diagnosis not present

## 2021-05-31 DIAGNOSIS — K7689 Other specified diseases of liver: Secondary | ICD-10-CM | POA: Diagnosis not present

## 2021-05-31 DIAGNOSIS — R1011 Right upper quadrant pain: Secondary | ICD-10-CM | POA: Insufficient documentation

## 2021-05-31 DIAGNOSIS — F17210 Nicotine dependence, cigarettes, uncomplicated: Secondary | ICD-10-CM | POA: Insufficient documentation

## 2021-05-31 DIAGNOSIS — Z79899 Other long term (current) drug therapy: Secondary | ICD-10-CM | POA: Diagnosis not present

## 2021-05-31 DIAGNOSIS — I129 Hypertensive chronic kidney disease with stage 1 through stage 4 chronic kidney disease, or unspecified chronic kidney disease: Secondary | ICD-10-CM | POA: Insufficient documentation

## 2021-05-31 DIAGNOSIS — N189 Chronic kidney disease, unspecified: Secondary | ICD-10-CM | POA: Insufficient documentation

## 2021-05-31 DIAGNOSIS — K219 Gastro-esophageal reflux disease without esophagitis: Secondary | ICD-10-CM | POA: Insufficient documentation

## 2021-05-31 DIAGNOSIS — R69 Illness, unspecified: Secondary | ICD-10-CM | POA: Diagnosis not present

## 2021-05-31 DIAGNOSIS — Z7982 Long term (current) use of aspirin: Secondary | ICD-10-CM | POA: Diagnosis not present

## 2021-05-31 LAB — CBC WITH DIFFERENTIAL/PLATELET
Absolute Monocytes: 729 cells/uL (ref 200–950)
Basophils Absolute: 38 cells/uL (ref 0–200)
Basophils Relative: 0.7 %
Eosinophils Absolute: 108 cells/uL (ref 15–500)
Eosinophils Relative: 2 %
HCT: 41 % (ref 38.5–50.0)
Hemoglobin: 14.2 g/dL (ref 13.2–17.1)
Lymphs Abs: 950 cells/uL (ref 850–3900)
MCH: 30.5 pg (ref 27.0–33.0)
MCHC: 34.6 g/dL (ref 32.0–36.0)
MCV: 88.2 fL (ref 80.0–100.0)
MPV: 9.6 fL (ref 7.5–12.5)
Monocytes Relative: 13.5 %
Neutro Abs: 3575 cells/uL (ref 1500–7800)
Neutrophils Relative %: 66.2 %
Platelets: 241 10*3/uL (ref 140–400)
RBC: 4.65 10*6/uL (ref 4.20–5.80)
RDW: 13.1 % (ref 11.0–15.0)
Total Lymphocyte: 17.6 %
WBC: 5.4 10*3/uL (ref 3.8–10.8)

## 2021-05-31 LAB — PROTIME-INR
INR: 15.1
INR: 1.1 (ref 0.8–1.2)
Prothrombin Time: 129.3 s — ABNORMAL HIGH (ref 9.0–11.5)
Prothrombin Time: 13.8 seconds (ref 11.4–15.2)

## 2021-05-31 LAB — COMPREHENSIVE METABOLIC PANEL
ALT: 8 U/L (ref 0–44)
AST: 12 U/L — ABNORMAL LOW (ref 15–41)
Albumin: 4 g/dL (ref 3.5–5.0)
Alkaline Phosphatase: 67 U/L (ref 38–126)
Anion gap: 9 (ref 5–15)
BUN: 11 mg/dL (ref 8–23)
CO2: 25 mmol/L (ref 22–32)
Calcium: 8.5 mg/dL — ABNORMAL LOW (ref 8.9–10.3)
Chloride: 103 mmol/L (ref 98–111)
Creatinine, Ser: 0.92 mg/dL (ref 0.61–1.24)
GFR, Estimated: >60 mL/min (ref >60)
Glucose, Bld: 97 mg/dL (ref 70–99)
Potassium: 3.8 mmol/L (ref 3.5–5.1)
Sodium: 137 mmol/L (ref 135–145)
Total Bilirubin: 0.5 mg/dL (ref 0.3–1.2)
Total Protein: 7.1 g/dL (ref 6.5–8.1)

## 2021-05-31 LAB — CBC
HCT: 41.4 % (ref 39.0–52.0)
Hemoglobin: 13.9 g/dL (ref 13.0–17.0)
MCH: 29.7 pg (ref 26.0–34.0)
MCHC: 33.6 g/dL (ref 30.0–36.0)
MCV: 88.5 fL (ref 80.0–100.0)
Platelets: 232 10*3/uL (ref 150–400)
RBC: 4.68 MIL/uL (ref 4.22–5.81)
RDW: 12.9 % (ref 11.5–15.5)
WBC: 5.3 10*3/uL (ref 4.0–10.5)
nRBC: 0 % (ref 0.0–0.2)

## 2021-05-31 LAB — LIPASE: Lipase: 5 U/L — ABNORMAL LOW (ref 7–60)

## 2021-05-31 LAB — HEMOGLOBIN A1C
Hgb A1c MFr Bld: 5.5 % of total Hgb (ref <5.7)
Mean Plasma Glucose: 111 mg/dL
eAG (mmol/L): 6.2 mmol/L

## 2021-05-31 LAB — TSH: TSH: 1.06 mIU/L (ref 0.40–4.50)

## 2021-05-31 LAB — LIPASE, BLOOD: Lipase: <10 U/L — ABNORMAL LOW (ref 11–51)

## 2021-05-31 LAB — AMYLASE: Amylase: 21 U/L (ref 21–101)

## 2021-05-31 IMAGING — US US ABDOMEN LIMITED
1 series · 14 of 25 positions shown · non-contrast
Comparison: None.

CLINICAL DATA: Right upper quadrant pain for 3 weeks.

EXAM:
ULTRASOUND ABDOMEN LIMITED RIGHT UPPER QUADRANT

[Series 1: us abdomen limited ruq (liver/gb) · 14 of 73 slices shown]
[im 1/73]
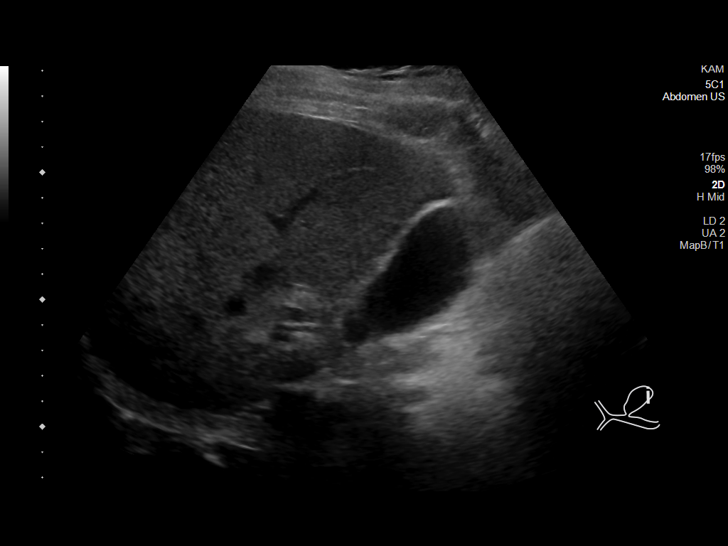
[im 7/73]
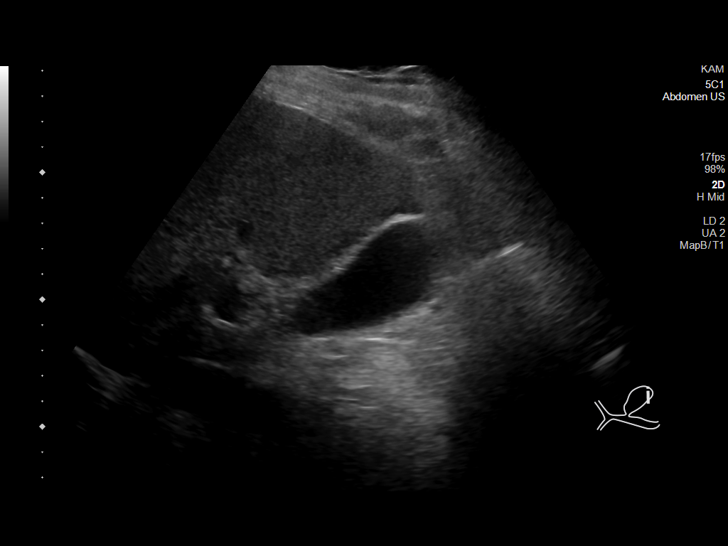
[im 13/73]
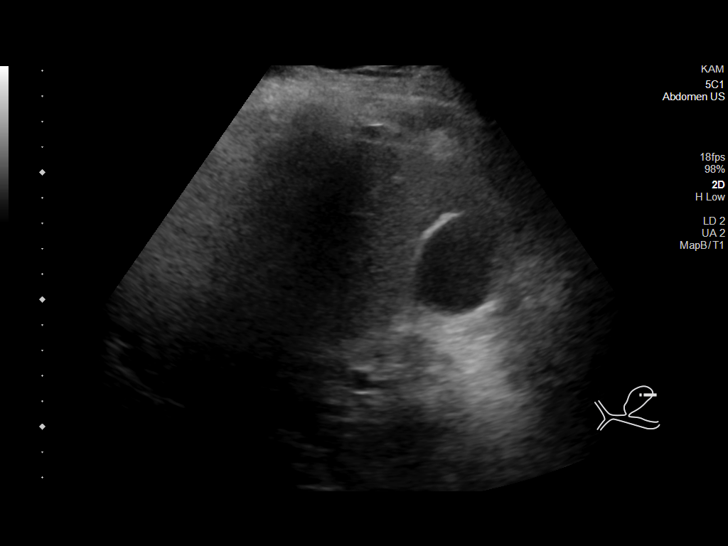
[im 19/73]
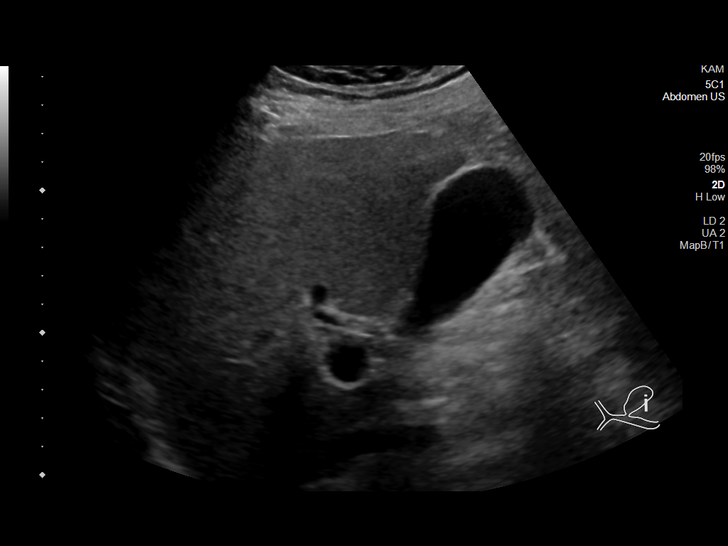
[im 25/73]
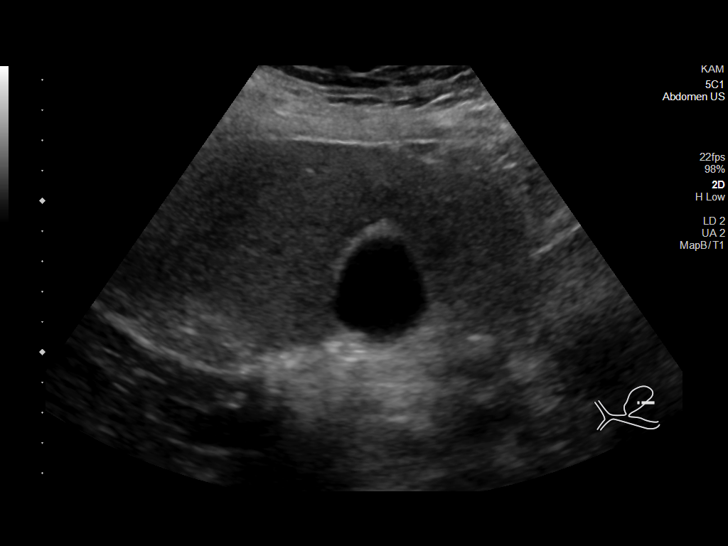
[im 28/73]
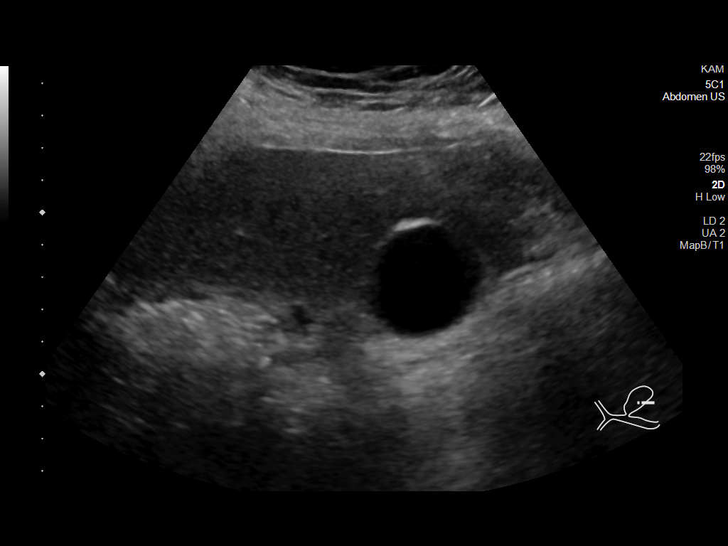
[im 34/73]
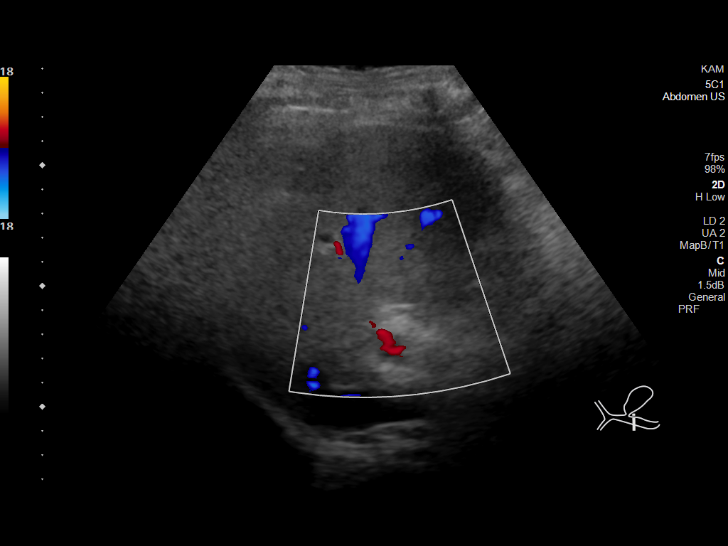
[im 40/73]
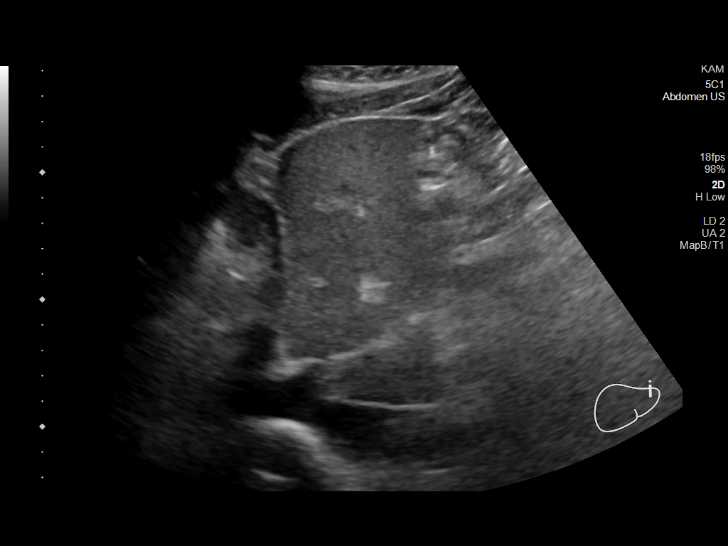
[im 46/73]
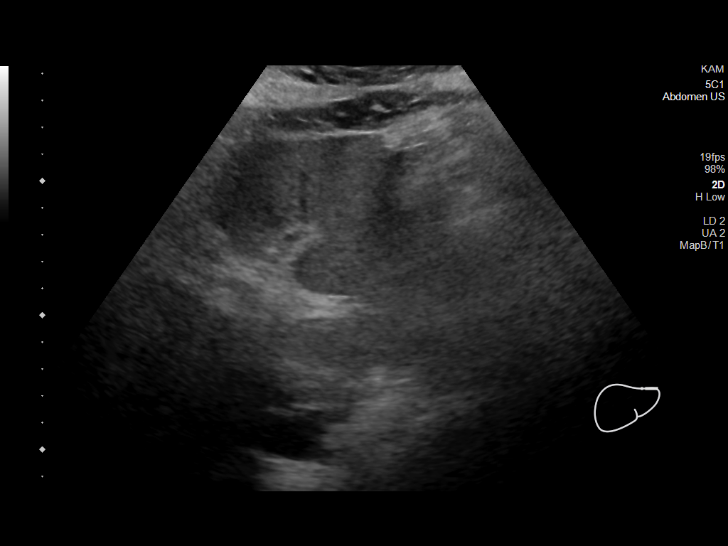
[im 49/73]
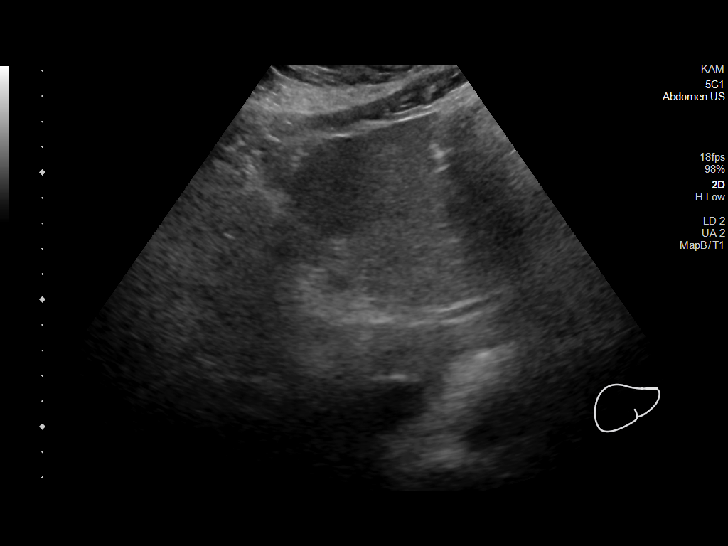
[im 55/73]
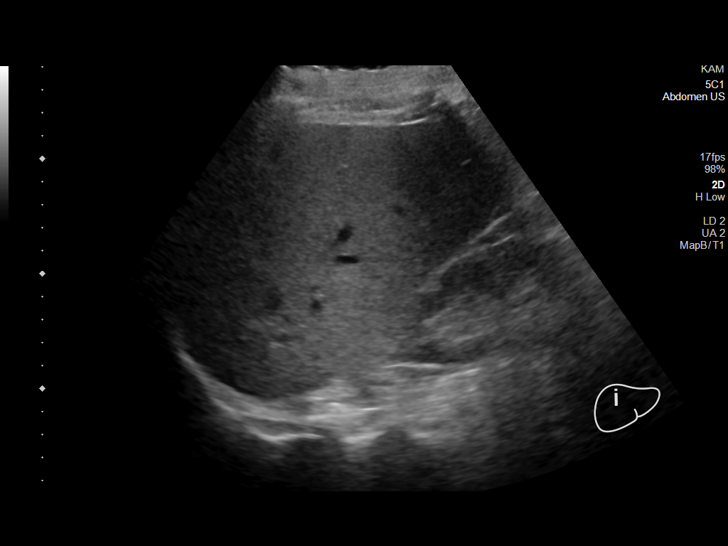
[im 61/73]
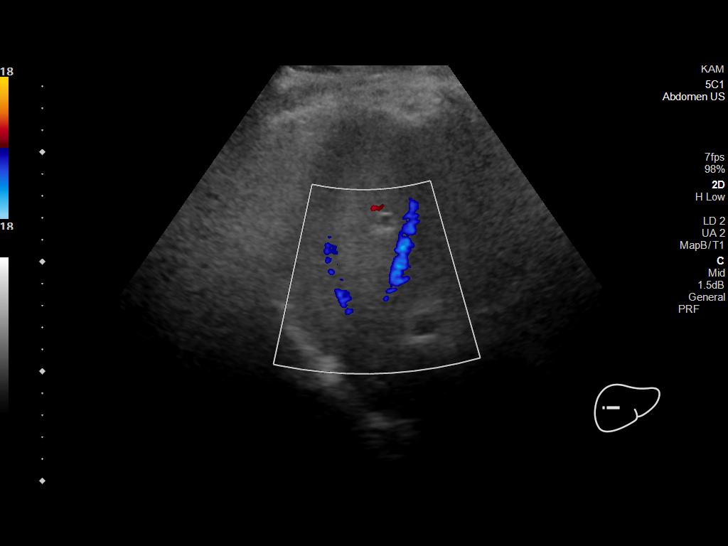
[im 67/73]
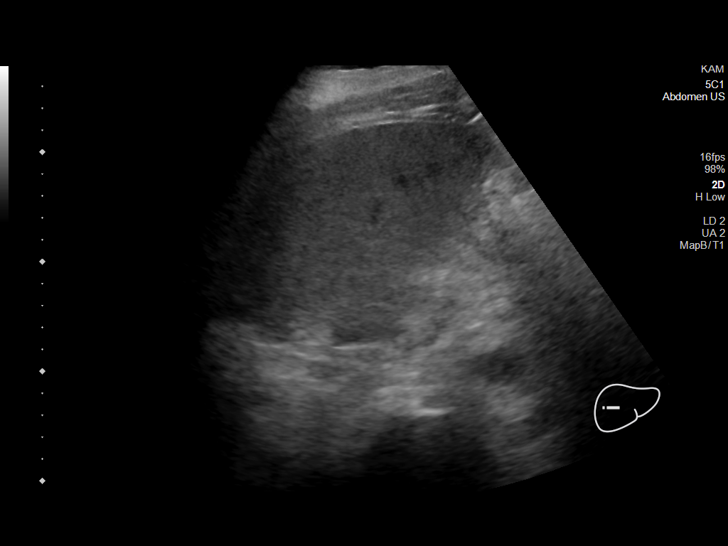
[im 73/73]
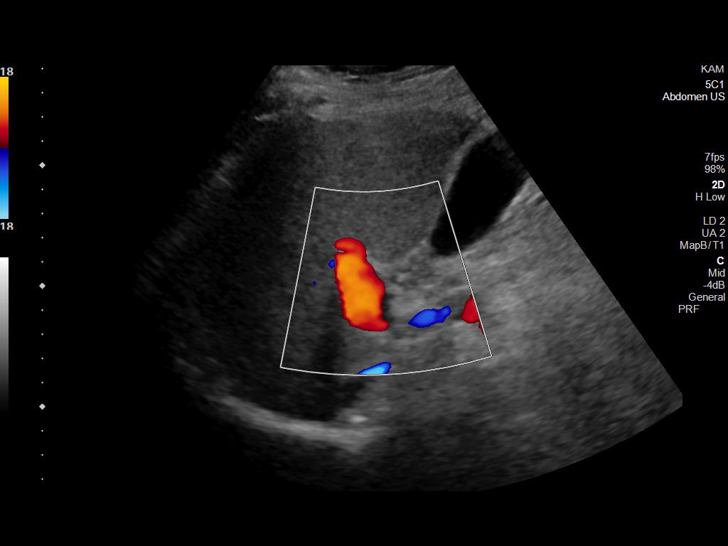

[14 of 25 positions shown; findings below may reference images not displayed]

FINDINGS: Gallbladder:

Borderline thickening of the gallbladder wall which measures 3.8 mm
in maximal thickness. No gallstones are seen. Normal distention of
the gallbladder. No sonographic Murphy sign noted by sonographer.

Common bile duct:

Diameter: 3.2 mm

Liver:

Diffusely increased coarse echogenicity of the liver. Portal vein is
patent on color Doppler imaging with normal direction of blood flow
towards the liver.

Other: None.
IMPRESSION: Mild thickening of the gallbladder wall without other secondary
signs of acute cholecystitis. Please correlate clinically.

Diffusely increased coarse echogenicity of the liver, consistent
with intrinsic liver disease.

## 2021-05-31 NOTE — Discharge Instructions (Addendum)
I discussed her care with the general surgeon, he thinks that you need to have more testing including a possible HIDA scan or referral to a GI specialist, please let your family doctor know that your ultrasound showed a little bit of gallbladder wall thickening, they will have access to your results through the computer system.  Please return to the emergency department for severe or worsening symptoms, your lab work was otherwise normal

## 2021-05-31 NOTE — ED Notes (Signed)
Pt dc home ,states understanding of dc instructions.  

## 2021-05-31 NOTE — ED Provider Notes (Signed)
[facility]   CSN: [id number] Arrival date & time: 05/31/21  1302     History Chief Complaint  Patient presents with   Abdominal Pain    Jesse HohJoseph Howard Hartis is a 65 y.o. male.   Abdominal Pain  This patient is a 65 year old male, known history of COPD, history of neuropathy hyperlipidemia and hypertension.  He reports that he has had several weeks of an abnormal color of his stool, it is pale, it has now lightened a little bit back to normal but is associated with some right upper quadrant pain.  He also has some nausea.  This is worse after eating.  He went to his doctor to have labs drawn and was called today to be informed that his labs are very abnormal in fact his INR was measured over 15, he is not anticoagulated and has no history of liver failure.  He is not complaining of any bleeding whatsoever.  Past Medical History:  Diagnosis Date   Allergy    Anxiety    Arthritis    Blood transfusion without reported diagnosis 1968   Chronic kidney disease    kidney stones   COPD (chronic obstructive pulmonary disease) (HCC)    Depression    GERD (gastroesophageal reflux disease)    Hyperlipidemia    Hypertension    Neuromuscular disorder (HCC)    neuropathy   Sleep apnea     Patient Active Problem List   Diagnosis Date Noted   Blurred vision, left eye 10/15/2019   Hyperlipidemia 09/06/2019   Low testosterone 09/06/2019   Polyp of colon 09/06/2019   Former cigarette smoker 12/30/2018   PTSD (post-traumatic stress disorder) 10/20/2018   Tremor 07/01/2018   Pain in joints of right hand 04/27/2018   Nephrolithiasis 04/19/2018   Traumatic partial tear of left biceps tendon 03/17/2018   Cubital tunnel syndrome of both upper extremities 02/07/2018   Hepatic cyst 02/07/2018   H/O cardiac catheterization 02/07/2018   Incomplete tear of left rotator cuff 02/07/2018   Renal cyst, left 02/07/2018   Chronic thoracic spine pain  (Primary Area of Pain) 02/02/2018   Chronic neck pain (Secondary Area of Pain) (R>L) 02/02/2018   Chronic pain of left upper extremity 02/02/2018   Chronic elbow pain, left [facility](Tertiary Area of Pain) 02/02/2018   Chronic pain of both shoulders (Fourth Area of Pain) (L>R) 02/02/2018   Chronic pain of both knees (L>R) 02/02/2018   Chronic hip pain, left 02/02/2018   Chronic groin pain, right 02/02/2018   Disorder of skeletal system 02/02/2018   Left elbow pain 02/01/2018   Benign essential tremor 01/20/2018   Imbalance 01/20/2018   Chronic bilateral thoracic back pain 01/10/2018   H/O sepsis 01/10/2018   Spinal stenosis of lumbar region 01/10/2018   COPD (chronic obstructive pulmonary disease) with chronic bronchitis (HCC) 01/04/2018   Essential hypertension 01/04/2018   Gastroesophageal reflux disease without esophagitis 01/04/2018   H/O brain tumor 01/04/2018   Idiopathic peripheral neuropathy 01/04/2018   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   OSA on CPAP 01/04/2018   Tobacco user 01/04/2018   Trigeminal neuralgia 01/04/2018    Past Surgical History:  Procedure Laterality Date   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   COLONOSCOPY     KNEE SURGERY Left 1987, 2013   SHOULDER SURGERY Right 2010   orthoscopy   TONSILLECTOMY  1964   VASECTOMY         Family History  Problem Relation Age  of Onset   Heart disease Mother    COPD Mother    Dementia Mother    Diabetes Mother    Diabetes Father     Social History   Tobacco Use   Smoking status: Every Day    Years: 40.00    Types: E-cigarettes, Cigarettes    Start date: 08/20/1980   Smokeless tobacco: Never   Tobacco comments:    up to 3PPD  Vaping Use   Vaping Use: Every day  Substance Use Topics   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 Authorizing Provider  aspirin 81 MG chewable tablet  Chew by mouth.    [provider]  carbamazepine (TEGRETOL XR) 200 MG 12 hr tablet Take 200 mg by mouth 2 (two) times daily.    [provider]  clobetasol ointment (TEMOVATE) 0.05 % Apply 1 application topically 2 (two) times daily. For 14 days. Repeat with recurrence / flare. 05/30/21   Janeece Agee, NP  doxazosin (CARDURA) 2 MG tablet Take by mouth. 11/20/19   [provider]  hydrALAZINE (APRESOLINE) 50 MG tablet Take 1 tablet (50 mg total) by mouth 3 (three) times daily. 11/20/20 02/18/21  Jarold Motto, PA  hydrOXYzine (ATARAX/VISTARIL) 25 MG tablet Take one 25 mg tablet nightly 08/20/20   Jarold Motto, PA  metroNIDAZOLE (FLAGYL) 500 MG tablet Take 1 tablet (500 mg total) by mouth 3 (three) times daily. 05/30/21   Janeece Agee, NP  modafinil (PROVIGIL) 200 MG tablet Take by mouth. 12/21/19   [provider]  omeprazole (PRILOSEC) 20 MG capsule Take 20 mg by mouth daily.    [provider]  permethrin (ELIMITE) 5 % cream Apply to entire body; leave on for 8 to 14 hours before washing off with water. May repeat this regimen daily for 7 days, and then twice weekly until symptoms have resolved. 01/21/21   Iva Boop, MD  pregabalin (LYRICA) 225 MG capsule Take 225 mg by mouth 2 (two) times daily.    [provider]  propranolol (INDERAL) 20 MG tablet Take 20 mg by mouth as needed. 07/19/20   [provider]  propranolol ER (INDERAL LA) 160 MG SR capsule Take 1 capsule (160 mg total) by mouth every morning. 11/20/20   Jarold Motto, PA  sildenafil (REVATIO) 20 MG tablet TAKE 2 TABLETS BY MOUTH ONCE DAILY AS NEEDED 1  HOUR  BEFORE  INTERCOURSE 12/21/19   [provider]  sulfamethoxazole-trimethoprim (BACTRIM DS) 800-160 MG tablet Take 1 tablet by mouth 2 (two) times daily. 05/30/21   Janeece Agee, NP  valsartan (DIOVAN) 160 MG tablet Take 1 tablet (160 mg total) by mouth 2 (two) times daily. 11/20/20   Jarold Motto, PA   venlafaxine (EFFEXOR) 100 MG tablet Take 1 tablet (100 mg total) by mouth 2 (two) times daily. 10/30/20   Jarold Motto, PA    Allergies    Bee venom, Naproxen, Penicillin g, Doxycycline, Drug ingredient [monosodium glutamate], Primidone, and Topiramate  Review of Systems   Review of Systems  Gastrointestinal:  Positive for abdominal pain.  All other systems reviewed and are negative.  Physical Exam Updated Vital Signs BP (!) 199/115 (BP Location: Right Arm)   Pulse (!) 51   Resp 18   SpO2 99%   Physical Exam Vitals and nursing note reviewed.  Constitutional:  General: He is not in acute distress.    Appearance: He is well-developed.  HENT:     Head: Normocephalic and atraumatic.     Mouth/Throat:     Pharynx: No oropharyngeal exudate.  Eyes:     General: No scleral icterus.       Right eye: No discharge.        Left eye: No discharge.     Conjunctiva/sclera: Conjunctivae normal.     Pupils: Pupils are equal, round, and reactive to light.  Neck:     Thyroid: No thyromegaly.     Vascular: No JVD.  Cardiovascular:     Rate and Rhythm: Normal rate and regular rhythm.     Heart sounds: Normal heart sounds. No murmur heard.   No friction rub. No gallop.  Pulmonary:     Effort: Pulmonary effort is normal. No respiratory distress.     Breath sounds: Normal breath sounds. No wheezing or rales.  Abdominal:     General: Bowel sounds are normal. There is no distension.     Palpations: Abdomen is soft. There is no mass.     Tenderness: There is abdominal tenderness in the right upper quadrant.  Musculoskeletal:        General: No tenderness. Normal range of motion.     Cervical back: Normal range of motion and neck supple.  Lymphadenopathy:     Cervical: No cervical adenopathy.  Skin:    General: Skin is warm and dry.     Findings: No erythema or rash.  Neurological:     Mental Status: He is alert.     Coordination: Coordination normal.  Psychiatric:         Behavior: Behavior normal.    ED Results / Procedures / Treatments   Labs (all labs ordered are listed, but only abnormal results are displayed) Labs Reviewed  COMPREHENSIVE METABOLIC PANEL - Abnormal; Notable for the following components:      Result Value   Calcium 8.5 (*)    AST 12 (*)    All other components within normal limits  LIPASE, BLOOD - Abnormal; Notable for the following components:   Lipase <10 (*)    All other components within normal limits  CBC  PROTIME-INR    EKG None  Radiology US Abdomen Limited RUQ (LIVER/GB)  Result Date: 05/31/2021 CLINICAL DATA:  Right upper quadrant pain for 3 weeks. EXAM: ULTRASOUND ABDOMEN LIMITED RIGHT UPPER QUADRANT COMPARISON:  None. FINDINGS: Gallbladder: Borderline thickening of the gallbladder wall which measures 3.8 mm in maximal thickness. No gallstones are seen. Normal distention of the gallbladder. No sonographic Murphy sign noted by sonographer. Common bile duct: Diameter: 3.2 mm Liver: Diffusely increased coarse echogenicity of the liver. Portal vein is patent on color Doppler imaging with normal direction of blood flow towards the liver. Other: None. IMPRESSION: Mild thickening of the gallbladder wall without other secondary signs of acute cholecystitis. Please correlate clinically. Diffusely increased coarse echogenicity of the liver, consistent with intrinsic liver disease. Electronically Signed   By: Ted Mcalpine M.D.   On: 05/31/2021 14:59    Procedures Procedures   Medications Ordered in ED Medications - No data to display  ED Course  I have reviewed the triage vital signs and the nursing notes.  Pertinent labs & imaging results that were available during my care of the patient were reviewed by me and considered in my medical decision making (see chart for details).    MDM Rules/Calculators/A&P  The patient has a subtle hint of jaundice to the eyes but overall appears well, there is  tenderness but no Murphy sign of the abdomen, he is not tachycardic or hypoxic and has no edema of his legs.  I have reviewed the labs that the patient presents and they look very abnormal but they do not make any sense.  He has no reason to be coagulopathic, will check labs, ultrasound of the right upper quadrant, n.p.o.  Patient is agreeable to the plan  The story is certainly concerning for a cholecystitis or some type of biliary process  I discussed the case with Dr. Dwain Sarna, thankfully the ultrasound shows a little gallbladder wall thickening but no other signs of cholecystitis and with normal lab work it is reasonable to have this patient follow-up.  He is not tachycardic febrile or hypotensive, well-appearing, stable for discharge, the patient is agreeable  Final Clinical Impression(s) / ED Diagnoses Final diagnoses:  Right upper quadrant abdominal pain    Rx / DC Orders ED Discharge Orders     None        Eber Hong, MD 05/31/21 1528

## 2021-05-31 NOTE — ED Notes (Signed)
Patient transported to ultrasound.

## 2021-05-31 NOTE — ED Notes (Signed)
Patient transported to Ultrasound 

## 2021-05-31 NOTE — Telephone Encounter (Signed)
8:33 Am  Unable to reach pt x 2 or his daughter x 1. Lab calling to report critical lab result. INR 15. He was seen yesterday for cat bite,skin rash,chronic diarrhea,acholic stool,and abdominal pain. Pt does not seem like pt is on anticoagulation. CMP results are not back.  Lab Results  Component Value Date   WBC 5.4 05/30/2021   HGB 14.2 05/30/2021   HCT 41.0 05/30/2021   MCV 88.2 05/30/2021   PLT 241 05/30/2021   Iniko Robles [place], MD  8:46 am tried to reach [name] again, left a 2nd message. Called her daughter again and no answer, left a message letting her know I am trying to reach her father for abnormal lab result. Jaja Switalski [place], MD  9:26 am tried to reach [name] again, no answer, left message instructing him to go to the ER due to abnormal lab result,"coagulation test."  9:32 Am Tried to reach [name] daughter, no answer. I left a 2nd message letting her know that I have tried to contact her father unsuccessfully and it is very important for him to go to the ER due to abnormal lab result. Joelyn Lover [place], MD

## 2021-05-31 NOTE — Telephone Encounter (Signed)
Mr Hopes called back. We discussed lab results, INR 15.1.  He is not on coumadin, no known hx of coagulation disorders or liver disease. CMP still pending. He was instructed to go to the ER, when further testing can be done. He voices understanding and agrees with plan. Eavan Gonterman [place], MD

## 2021-05-31 NOTE — ED Triage Notes (Signed)
Pt having abdominal pain for 3 weeks/diarrhea. Primary MD did labs yesterday and called pt to tell him he was needing immediate attention, INR was elevated.

## 2021-06-01 ENCOUNTER — Encounter: Payer: Self-pay | Admitting: Registered Nurse

## 2021-06-01 NOTE — Telephone Encounter (Signed)
Thank you for reaching out. CMP back in case you're still on call and he reaches out again - Fairly benign results compared to INR, plan will be to pursue more urgent imaging . Will get in touch with him through my office Monday morning to coordinate this.  Thank you again  Luan Pulling

## 2021-06-06 ENCOUNTER — Other Ambulatory Visit: Payer: Self-pay | Admitting: Registered Nurse

## 2021-06-06 ENCOUNTER — Other Ambulatory Visit: Payer: Medicare HMO

## 2021-06-06 ENCOUNTER — Other Ambulatory Visit: Payer: Self-pay

## 2021-06-06 DIAGNOSIS — R1011 Right upper quadrant pain: Secondary | ICD-10-CM | POA: Diagnosis not present

## 2021-06-06 DIAGNOSIS — R195 Other fecal abnormalities: Secondary | ICD-10-CM

## 2021-06-06 NOTE — Progress Notes (Signed)
Pt presented to ED, found with fairly benign labs.  Will repeat.  Order MRCP given abnormalities on RUQ [place] that were nondiagnostic as to cause of his symptoms  Pt informed via MyChart.  Jari Sportsman, NP

## 2021-06-10 LAB — HEPATITIS PANEL(REFL)
HEPATITIS C ANTIBODY REFILL$(REFL): NONREACTIVE
Hep B Core Total Ab: NONREACTIVE
Hep B S Ab: NONREACTIVE
Hepatitis A AB,Total: NONREACTIVE
Hepatitis B Surface Ag: NONREACTIVE
SIGNAL TO CUT-OFF: 0.01 (ref <1.00)

## 2021-06-10 LAB — PROTIME-INR
INR: 1
Prothrombin Time: 10.5 s (ref 9.0–11.5)

## 2021-06-10 LAB — PATHOLOGIST SMEAR REVIEW

## 2021-06-10 LAB — REFLEX TIQ

## 2021-06-30 ENCOUNTER — Ambulatory Visit (HOSPITAL_COMMUNITY): Payer: Medicare HMO

## 2021-07-07 ENCOUNTER — Ambulatory Visit (HOSPITAL_COMMUNITY): Admission: RE | Admit: 2021-07-07 | Payer: Medicare HMO | Source: Ambulatory Visit

## 2021-07-07 ENCOUNTER — Telehealth: Payer: Self-pay

## 2021-07-07 ENCOUNTER — Ambulatory Visit (HOSPITAL_COMMUNITY): Payer: Medicare HMO

## 2021-07-07 NOTE — Telephone Encounter (Signed)
[facility]) called and said that the CPT Code is incorrect on order

## 2021-07-07 NOTE — Telephone Encounter (Signed)
A user error has taken place: encounter opened in error, closed for administrative reasons.

## 2021-07-07 NOTE — Telephone Encounter (Signed)
Forwarded to Richard to Change the CPT Code

## 2021-07-07 NOTE — Telephone Encounter (Signed)
Did they provide another CPT code that should be used?   Thanks,  Luan Pulling

## 2021-07-14 ENCOUNTER — Other Ambulatory Visit: Payer: Self-pay | Admitting: Registered Nurse

## 2021-07-14 ENCOUNTER — Ambulatory Visit (HOSPITAL_COMMUNITY): Payer: Medicare HMO

## 2021-07-14 ENCOUNTER — Other Ambulatory Visit: Payer: Self-pay

## 2021-07-14 ENCOUNTER — Ambulatory Visit (HOSPITAL_COMMUNITY)
Admission: RE | Admit: 2021-07-14 | Discharge: 2021-07-14 | Disposition: A | Discharge status: Home / Self Care | Payer: Medicare HMO | Source: Ambulatory Visit | Attending: Registered Nurse | Admitting: Registered Nurse

## 2021-07-14 ENCOUNTER — Encounter (HOSPITAL_COMMUNITY): Payer: Self-pay

## 2021-07-14 DIAGNOSIS — R195 Other fecal abnormalities: Secondary | ICD-10-CM | POA: Diagnosis not present

## 2021-07-14 DIAGNOSIS — R1011 Right upper quadrant pain: Secondary | ICD-10-CM | POA: Diagnosis not present

## 2021-07-14 DIAGNOSIS — K7689 Other specified diseases of liver: Secondary | ICD-10-CM | POA: Diagnosis not present

## 2021-07-14 DIAGNOSIS — N281 Cyst of kidney, acquired: Secondary | ICD-10-CM | POA: Diagnosis not present

## 2021-07-14 DIAGNOSIS — R935 Abnormal findings on diagnostic imaging of other abdominal regions, including retroperitoneum: Secondary | ICD-10-CM | POA: Diagnosis not present

## 2021-07-14 IMAGING — MR MR ABDOMEN WO/W CM MRCP
19 of 21 series · 43 of 48 positions shown · IV contrast (gadavist)
Comparison: Ultrasound on [DATE]

CLINICAL DATA: Right upper quadrant pain. Suspected hepatocellular
disease on recent ultrasound.

EXAM:
MRI ABDOMEN WITHOUT AND WITH CONTRAST (INCLUDING MRCP)
TECHNIQUE: Multiplanar multisequence MR imaging of the abdomen was performed
both before and after the administration of intravenous contrast.
Heavily T2-weighted images of the biliary and pancreatic ducts were
obtained, and three-dimensional MRCP images were rendered by post
processing.
CONTRAST:  10mL GADAVIST GADOBUTROL 1 MMOL/ML IV SOLN

[Series 2: DWI · axial · 6.0mm · 1.60mm/px · z∈[-166,+115]mm · 3 of 80 slices shown (1 of 2)]
[im 1/80]
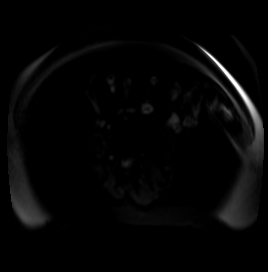
[im 40/80]
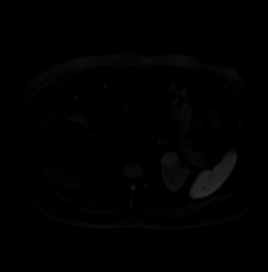
[im 80/80]
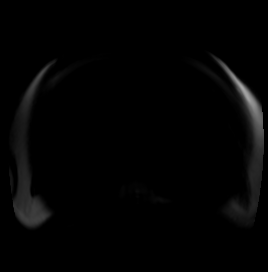

[Series 3: DWI · axial · 6.0mm · 1.60mm/px · 1 of 40 slices shown (2 of 2)]
[im 1/40]
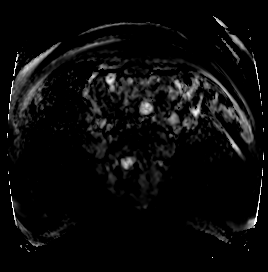

[Series 4: T2 fat-sat · axial · 6.0mm · 1.34mm/px · 1 of 42 slices shown]
[im 1/42]
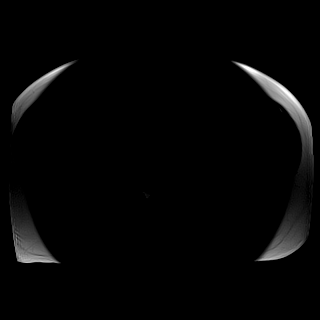

[Series 8: T2 · coronal · 6.0mm · 1.48mm/px · 1 of 42 slices shown (1 of 2)]
[im 1/42]
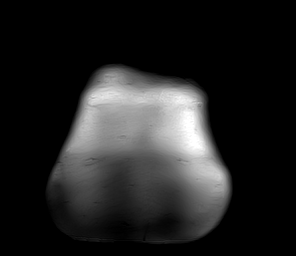

[Series 9: T1 · axial · 3.4mm · 1.25mm/px · z∈[-171,+125]mm · 3 of 88 slices shown (1 of 2)]
[im 1/88]
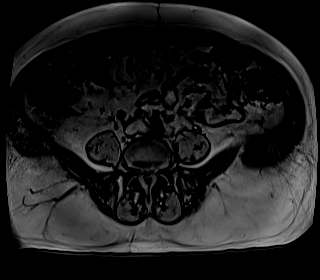
[im 44/88]
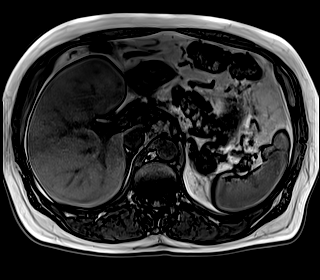
[im 88/88]
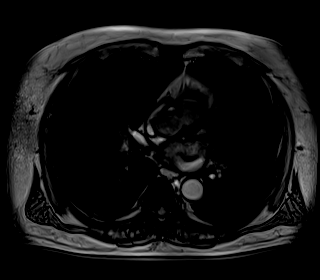

[Series 10: T1 · axial · 3.4mm · 1.25mm/px · z∈[-171,+125]mm · 3 of 88 slices shown (2 of 2)]
[im 1/88]
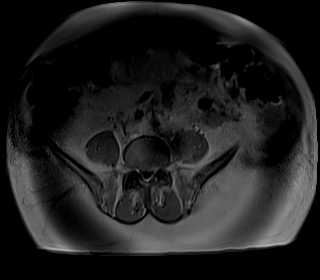
[im 44/88]
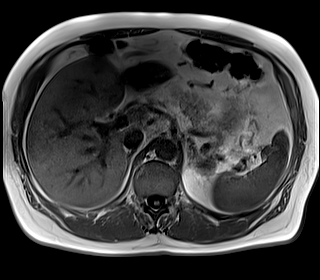
[im 88/88]
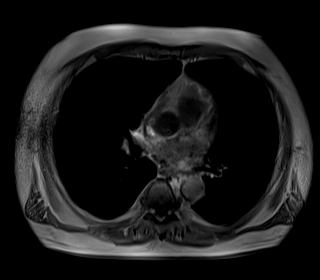

[Series 13: cor_3d_spc_trig · coronal · 1.0mm · 0.49mm/px · 2 of 72 slices shown]
[im 1/72]
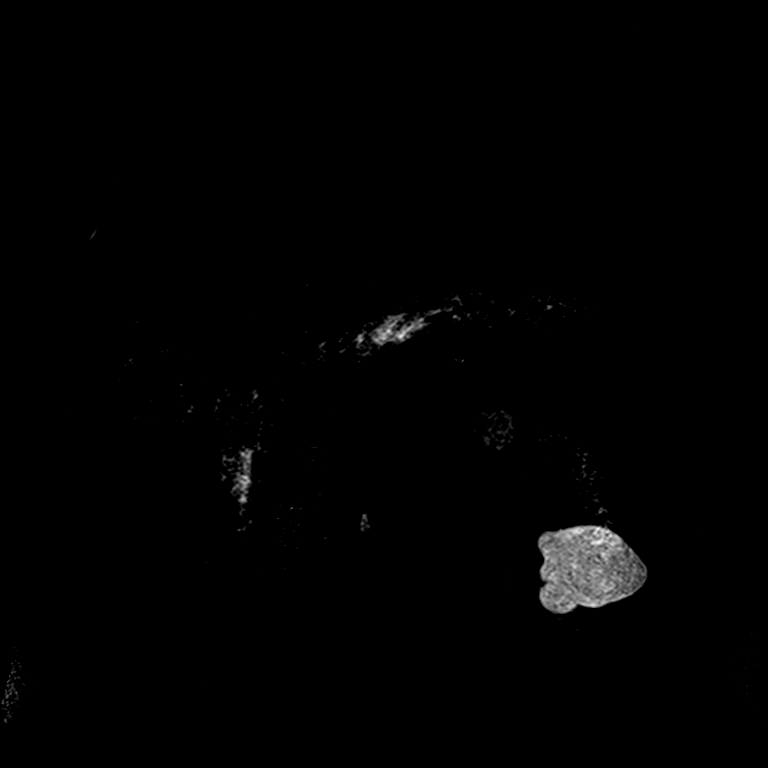
[im 72/72]
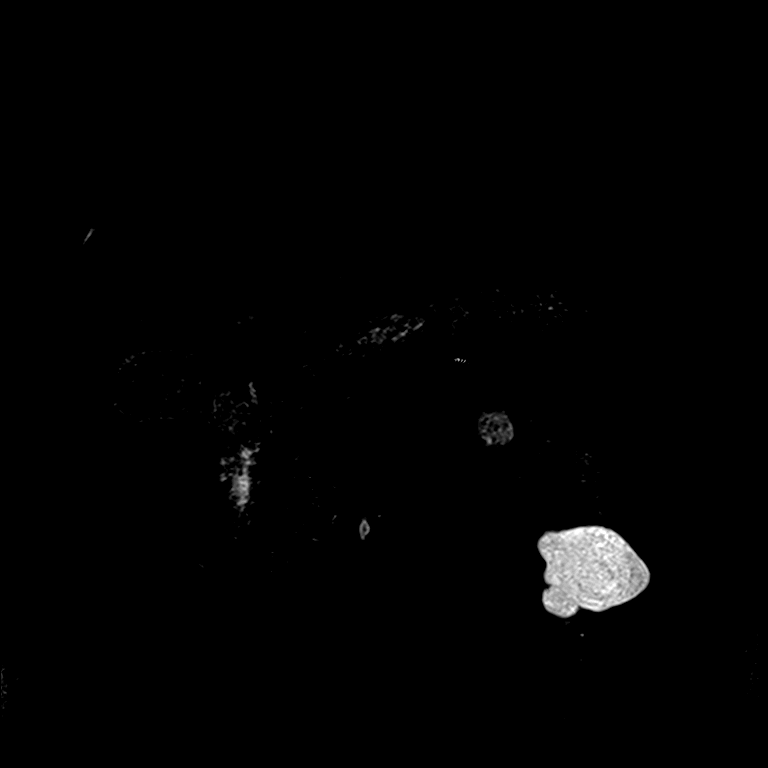

[Series 15: cor obl thk · sagittal · 50.0mm · 0.78mm/px · 1 of 9 slices shown]
[im 1/9]
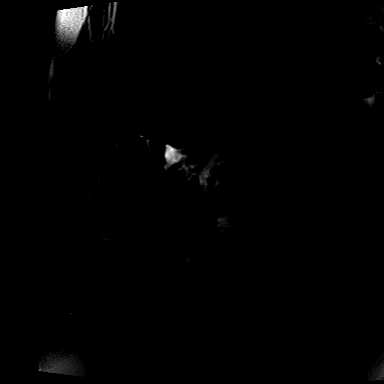

[Series 16: T2 · axial · 6.0mm · 1.56mm/px · 1 of 41 slices shown (2 of 2)]
[im 1/41]
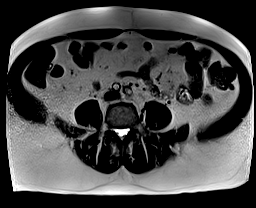

[Series 18: T1 dynamic · axial · 3.0mm · 1.34mm/px · z∈[-166,+119]mm · 3 of 96 slices shown (1 of 10)]
[im 1/96]
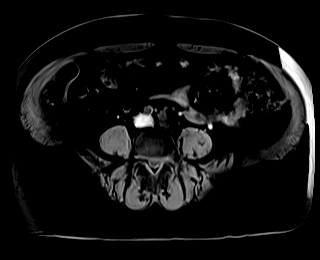
[im 48/96]
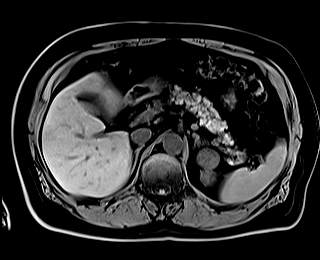
[im 96/96]
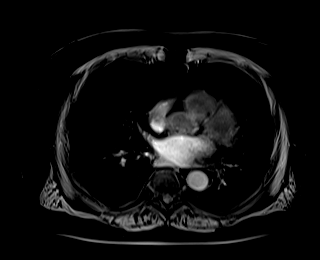

[Series 22: T1 dynamic · axial · 3.0mm · 1.34mm/px · z∈[-166,+119]mm · 3 of 96 slices shown (2 of 10)]
[im 1/96]
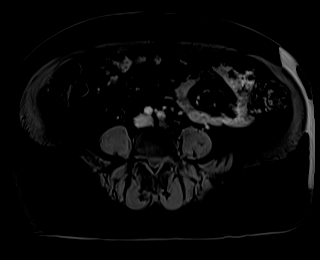
[im 48/96]
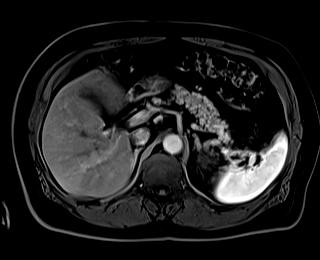
[im 96/96]
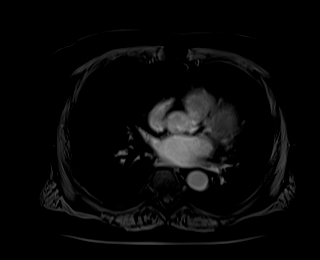

[Series 23: T1 dynamic · axial · 3.0mm · 1.34mm/px · z∈[-166,+119]mm · 3 of 96 slices shown (3 of 10)]
[im 1/96]
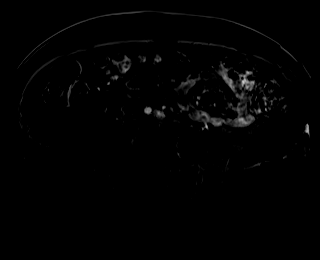
[im 48/96]
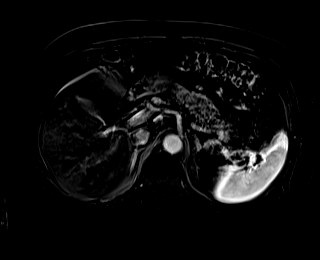
[im 96/96]
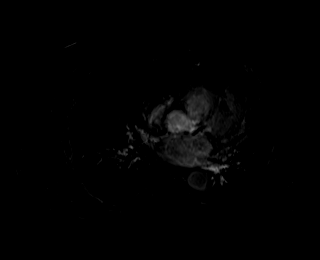

[Series 26: T1 dynamic · axial · 3.0mm · 1.34mm/px · z∈[-166,+119]mm · 3 of 96 slices shown (4 of 10)]
[im 1/96]
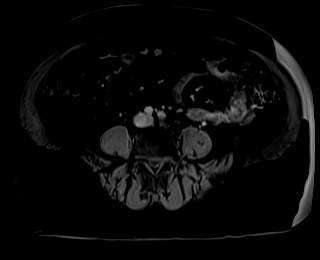
[im 48/96]
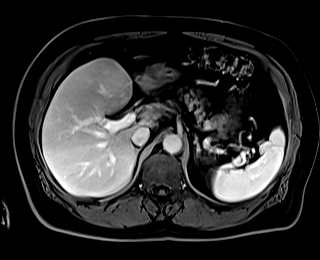
[im 96/96]
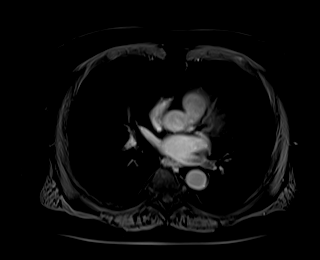

[Series 27: T1 dynamic · axial · 3.0mm · 1.34mm/px · z∈[-166,+119]mm · 3 of 96 slices shown (5 of 10)]
[im 1/96]
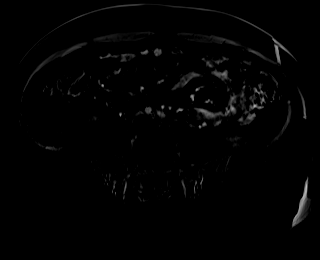
[im 48/96]
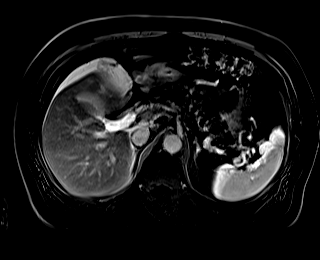
[im 96/96]
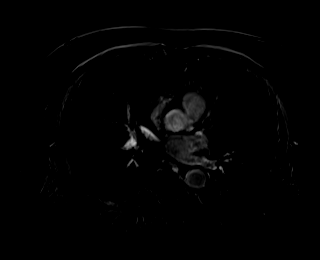

[Series 30: T1 dynamic · axial · 3.0mm · 1.34mm/px · z∈[-166,+119]mm · 3 of 96 slices shown (6 of 10)]
[im 1/96]
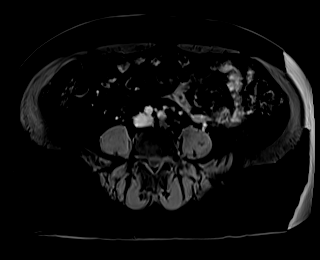
[im 48/96]
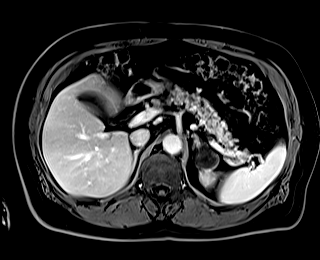
[im 96/96]
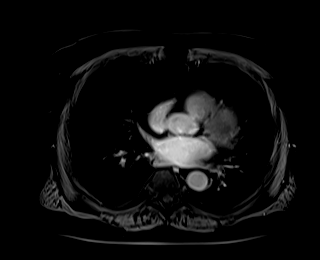

[Series 31: T1 dynamic · axial · 3.0mm · 1.34mm/px · z∈[-166,+119]mm · 3 of 96 slices shown (7 of 10)]
[im 1/96]
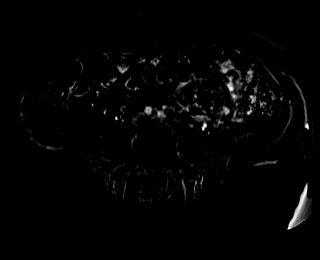
[im 48/96]
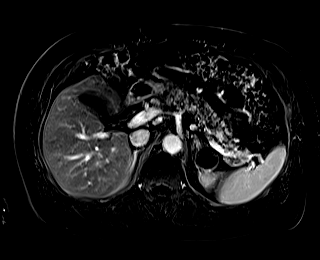
[im 96/96]
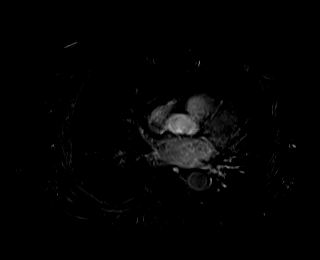

[Series 33: T1 dynamic · coronal · 5.0mm · 1.41mm/px · 2 of 60 slices shown (8 of 10)]
[im 1/60]
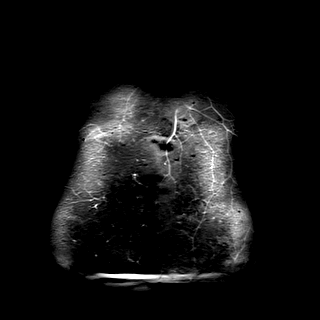
[im 60/60]
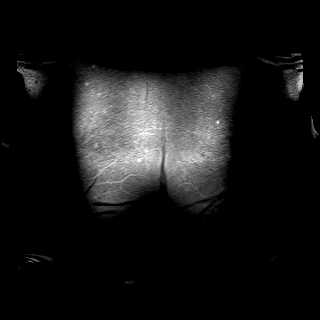

[Series 36: T1 dynamic · axial · 3.0mm · 1.34mm/px · z∈[-166,+119]mm · 3 of 96 slices shown (9 of 10)]
[im 1/96]
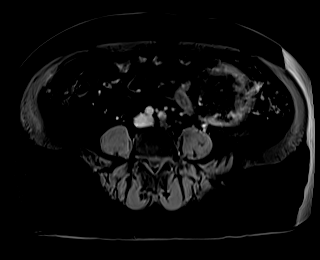
[im 48/96]
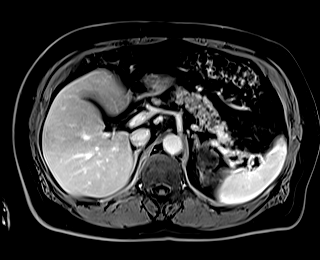
[im 96/96]
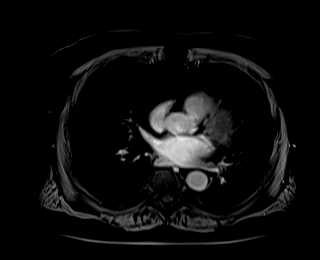

[Series 37: T1 dynamic · axial · 3.0mm · 1.34mm/px · 1 of 96 slices shown (10 of 10)]
[im 1/96]
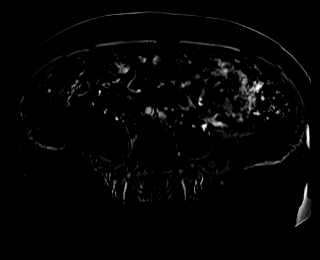

[43 of 48 positions shown; findings below may reference images not displayed]

FINDINGS: Lower chest: No acute findings.

Hepatobiliary: A tiny sub-cm cyst is seen in segment 2 of the left
hepatic lobe. No hepatic masses identified. No signs of hepatic
steatosis or cirrhosis are identified. Gallbladder is unremarkable.
No evidence of biliary ductal dilatation or choledocholithiasis.

Pancreas: No mass or inflammatory changes. No evidence of pancreatic
ductal dilatation or pancreas divisum.

Spleen:  Within normal limits in size and appearance.

Adrenals/Urinary Tract: No masses identified. A small simple
subcapsular cyst is seen in the upper pole of the left kidney. A
cm cyst with a few thin internal septations is noted in the lower
pole of the left kidney, consistent with a benign Bosniak category 2
cyst. No thickened or irregular septations seen. No solid enhancing
components identified. No evidence of hydronephrosis.

Stomach/Bowel: Visualized portion unremarkable.

Vascular/Lymphatic: No pathologically enlarged lymph nodes
identified. No acute vascular findings.

Other:  None.

Musculoskeletal:  No suspicious bone lesions identified.
IMPRESSION: No acute findings. No evidence of hepatocellular disease or other
significant abnormality.

## 2021-07-14 MED ORDER — GADOBUTROL 1 MMOL/ML IV SOLN
10.0000 mL | Freq: Once | INTRAVENOUS | Status: AC | PRN
  Administered 2021-07-14: 10 mL via INTRAVENOUS

## 2021-07-15 ENCOUNTER — Encounter: Payer: Self-pay | Admitting: Registered Nurse

## 2021-07-30 ENCOUNTER — Encounter: Payer: Self-pay | Admitting: Physician Assistant

## 2021-07-30 ENCOUNTER — Ambulatory Visit (INDEPENDENT_AMBULATORY_CARE_PROVIDER_SITE_OTHER): Payer: Medicare HMO | Admitting: Physician Assistant

## 2021-07-30 ENCOUNTER — Other Ambulatory Visit: Payer: Self-pay

## 2021-07-30 VITALS — BP 170/110 | HR 54 | Ht 74.0 in | Wt 283.0 lb

## 2021-07-30 DIAGNOSIS — R1011 Right upper quadrant pain: Secondary | ICD-10-CM

## 2021-07-30 DIAGNOSIS — F419 Anxiety disorder, unspecified: Secondary | ICD-10-CM | POA: Diagnosis not present

## 2021-07-30 DIAGNOSIS — G250 Essential tremor: Secondary | ICD-10-CM

## 2021-07-30 DIAGNOSIS — R21 Rash and other nonspecific skin eruption: Secondary | ICD-10-CM

## 2021-07-30 DIAGNOSIS — L0291 Cutaneous abscess, unspecified: Secondary | ICD-10-CM | POA: Diagnosis not present

## 2021-07-30 DIAGNOSIS — I10 Essential (primary) hypertension: Secondary | ICD-10-CM

## 2021-07-30 DIAGNOSIS — F32A Depression, unspecified: Secondary | ICD-10-CM

## 2021-07-30 DIAGNOSIS — G4700 Insomnia, unspecified: Secondary | ICD-10-CM

## 2021-07-30 DIAGNOSIS — R69 Illness, unspecified: Secondary | ICD-10-CM | POA: Diagnosis not present

## 2021-07-30 MED ORDER — VENLAFAXINE HCL 100 MG PO TABS: 100.0000 mg | ORAL_TABLET | Freq: Two times a day (BID) | ORAL | 1 refills | Status: DC

## 2021-07-30 MED ORDER — HYDROXYZINE HCL 25 MG PO TABS: ORAL_TABLET | ORAL | 1 refills | Status: AC

## 2021-07-30 MED ORDER — LOSARTAN POTASSIUM 50 MG PO TABS: 50.0000 mg | ORAL_TABLET | Freq: Every day | ORAL | 0 refills | Status: DC

## 2021-07-30 MED ORDER — SULFAMETHOXAZOLE-TRIMETHOPRIM 800-160 MG PO TABS: 1.0000 | ORAL_TABLET | Freq: Two times a day (BID) | ORAL | 0 refills | Status: DC

## 2021-07-30 MED ORDER — PROPRANOLOL HCL ER 60 MG PO CP24: 60.0000 mg | ORAL_CAPSULE | Freq: Every day | ORAL | 0 refills | Status: AC

## 2021-07-30 NOTE — Progress Notes (Signed)
Jesse Cortez is a 65 y.o. male here for a follow up of a pre-existing problem.   History of Present Illness:   Chief Complaint  Patient presents with   Growth behind right ear.    HPI  Lump behind R ear He noted waking up and was experiencing pain behind his R ear. After a while he felt fluid running down from behind his ear and checked in the mirror, finding blood, plasma and white dots in the fluid. He admits to having had a similar condition 35 years ago, and a predilection for cysts which he removes by hand. Denies: fevers, chills, nausea, vomiting.  Depression/Anxiety He admits to a diagnosis of anxiety and depression. He is currently taking Effexor 200 mg daily. Tolerating well.  HTN Currently taking no medications as he has run out of everything. At home blood pressure readings are: not checked. Patient denies chest pain, SOB, blurred vision, dizziness, unusual headaches, lower leg swelling. Denies excessive caffeine intake, stimulant usage, excessive alcohol intake, or increase in salt consumption.  BP Readings from Last 3 Encounters:  07/30/21 (!) 170/110  05/31/21 (!) 199/115  05/30/21 128/84   Hand Tremors He admits that he was on a long-acting and short-acting propanolol due to hand tremors. Has been out of his long-acting propranolol 160 mg daily.  Insomnia He has had sleep issues for the past several months, which he had hydroxyzine for to help alleviate, but has been running out.  Skin issues He has significant issues with his skin. Multiple areas that open, scabbed at times and broken open. He saw [place] for this in Jan 2022. We trialed permethrin and doxycycline however he had rash/itching with doxycyline so he did not complete this medication. 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 His symptoms did not improve with this.  RUQ Abdominal Pain  Was seen by Freddy Finner NP on 05/30/21 for this. Notes reviewed. He ended up being advised to go to the ER after that visit due to INR was 15.1. Repeat INR at ER was WNL. He did have abd u/s that showed:  IMPRESSION: Mild thickening of the gallbladder wall without other secondary signs of acute cholecystitis. Please correlate clinically. Diffusely increased coarse echogenicity of the liver, consistent with intrinsic liver disease.  Wt Readings from Last 4 Encounters:  07/30/21 283 lb (128.4 kg)  05/30/21 283 lb 6.4 oz (128.5 kg)  01/30/21 297 lb 6.4 oz (134.9 kg)  01/21/21 299 lb 6.4 oz (135.8 kg)   He then had MRCP on 07/14/21 that was WNL. He states that his symptoms have overall returned to normal and he has cleaned up his diet. Weight has stabilized. No rectal bleeding, n/v.     Past Medical History:  Diagnosis Date   Allergy    Anxiety    Arthritis    Blood transfusion without reported diagnosis 1968   Chronic kidney disease    kidney stones   COPD (chronic obstructive pulmonary disease) (HCC)    Depression    GERD (gastroesophageal reflux disease)    Hyperlipidemia    Hypertension    Neuromuscular disorder (HCC)    neuropathy   Sleep apnea      Social History   Tobacco Use   Smoking status: Every Day    Years: 40.00    Types: E-cigarettes, Cigarettes    Start date: 08/20/1980   Smokeless tobacco: Never  Tobacco comments:    up to 3PPD  Vaping Use   Vaping Use: Every day  Substance Use Topics   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   Drug use: No    Past Surgical History:  Procedure Laterality Date   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   COLONOSCOPY     KNEE SURGERY Left 1987, 2013   SHOULDER SURGERY Right 2010   orthoscopy   TONSILLECTOMY  1964   VASECTOMY      Family History  Problem Relation Age of Onset   Heart disease Mother    COPD Mother    Dementia Mother    Diabetes Mother    Diabetes Father     Allergies  Allergen Reactions   Bee Venom  Anaphylaxis   Naproxen Anaphylaxis   Penicillin G Anaphylaxis   Doxycycline     Rash and itching    Drug Ingredient [Monosodium Glutamate] Other (See Comments)    Headache   Primidone     Other reaction(s): Unknown   Topiramate Other (See Comments)    "Petit mal seizures"     Current Medications:   Current Outpatient Medications:    propranolol (INDERAL) 20 MG tablet, Take 20 mg by mouth as needed., Disp: , Rfl:    venlafaxine (EFFEXOR) 100 MG tablet, Take 1 tablet (100 mg total) by mouth 2 (two) times daily., Disp: 180 tablet, Rfl: 1   aspirin 81 MG chewable tablet, Chew by mouth. (Patient not taking: Reported on 07/30/2021), Disp: , Rfl:    carbamazepine (TEGRETOL XR) 200 MG 12 hr tablet, Take 200 mg by mouth 2 (two) times daily. (Patient not taking: Reported on 07/30/2021), Disp: , Rfl:    clobetasol ointment (TEMOVATE) 0.05 %, Apply 1 application topically 2 (two) times daily. For 14 days. Repeat with recurrence / flare. (Patient not taking: Reported on 07/30/2021), Disp: 60 g, Rfl: 0   doxazosin (CARDURA) 2 MG tablet, Take by mouth. (Patient not taking: Reported on 07/30/2021), Disp: , Rfl:    hydrALAZINE (APRESOLINE) 50 MG tablet, Take 1 tablet (50 mg total) by mouth 3 (three) times daily., Disp: 270 tablet, Rfl: 3   hydrOXYzine (ATARAX/VISTARIL) 25 MG tablet, Take one 25 mg tablet nightly (Patient not taking: Reported on 07/30/2021), Disp: 90 tablet, Rfl: 1   modafinil (PROVIGIL) 200 MG tablet, Take by mouth., Disp: , Rfl:    omeprazole (PRILOSEC) 20 MG capsule, Take 20 mg by mouth daily., Disp: , Rfl:    pregabalin (LYRICA) 225 MG capsule, Take 225 mg by mouth 2 (two) times daily. (Patient not taking: Reported on 07/30/2021), Disp: , Rfl:    propranolol ER (INDERAL LA) 160 MG SR capsule, Take 1 capsule (160 mg total) by mouth every morning. (Patient not taking: Reported on 07/30/2021), Disp: 90 capsule, Rfl: 2   sildenafil (REVATIO) 20 MG tablet, TAKE 2 TABLETS BY MOUTH ONCE DAILY AS  NEEDED 1  HOUR  BEFORE  INTERCOURSE (Patient not taking: Reported on 07/30/2021), Disp: , Rfl:    valsartan (DIOVAN) 160 MG tablet, Take 1 tablet (160 mg total) by mouth 2 (two) times daily. (Patient not taking: Reported on 07/30/2021), Disp: 180 tablet, Rfl: 3   Review of Systems:   Review of Systems  Constitutional: Negative.  Negative for fever and malaise/fatigue.  HENT:  Positive for ear discharge and ear pain. Negative for hearing loss.   Eyes:  Negative for pain.  Cardiovascular:  Negative for chest pain, palpitations  and leg swelling.  Gastrointestinal:  Negative for nausea and vomiting.  Neurological:  Positive for tremors and headaches. Negative for dizziness, seizures and weakness.  Psychiatric/Behavioral:  Positive for depression. Negative for hallucinations. The patient is nervous/anxious.    Vitals:   Vitals:   07/30/21 1259  BP: (!) 170/110  Pulse: (!) 54  SpO2: 98%  Weight: 283 lb (128.4 kg)  Height: 6\' 2"  (1.88 m)     Body mass index is 36.34 kg/m.  Physical Exam:   Physical Exam Constitutional:      Appearance: Normal appearance. He is normal weight.  HENT:     Head: Normocephalic and atraumatic.     Right Ear: Tympanic membrane, ear canal and external ear normal.     Left Ear: Tympanic membrane, ear canal and external ear normal.     Nose: Nose normal.     Mouth/Throat:     Mouth: Mucous membranes are moist.     Pharynx: Oropharynx is clear.  Eyes:     Extraocular Movements: Extraocular movements intact.     Conjunctiva/sclera: Conjunctivae normal.     Pupils: Pupils are equal, round, and reactive to light.  Cardiovascular:     Rate and Rhythm: Normal rate and regular rhythm.     Pulses: Normal pulses.     Heart sounds: Normal heart sounds.  Pulmonary:     Effort: Pulmonary effort is normal.     Breath sounds: Normal breath sounds.  Abdominal:     General: Bowel sounds are normal.  Musculoskeletal:        General: Normal range of motion.      Cervical back: Normal range of motion and neck supple.  Skin:    Comments: Approximately 1.5 raised lesion with fluctuance and induration in post auricular area of R ear with central scabbing, warmth and TTP  Scattered erythematous lesions throughout body -- on bilateral arms, legs, trunk with excoriations  Neurological:     Mental Status: He is alert.   I&D  Meds, vitals, and allergies reviewed.  Indication: suspect abscess Pt complaints of: erythema, pain, swelling Location: postauricular R ear Size: approx 1.5 cm  Informed consent obtained.  Pt aware of risks not limited to but including infection, bleeding, damage to near by organs. Prep: etoh/betadine Anesthesia: 1%lidocaine with epi, good effect Incision made with #11 blade  Wound explored   Tolerated well   Assessment and Plan:   1. Abscess I&D performed Tolerated well Bactrim prescribed, has tolerated in the past Follow-up as needed  2. Anxiety and depression Well controlled with effexor 200 mg daily  Follow-up in 6 months, sooner if concerns  3. Essential hypertension Uncontrolled Noncompliant Start losartan 50 mg daily and propranolol ER 60 mg daily Follow-up in 1 month, sooner if concerns  4. Benign essential tremor Restart propranolol ER 60 mg daily  Follow-up as needed  5. Insomnia, unspecified type Restart 25 mg atarax at night Follow-up as needed  6. Rash and nonspecific skin eruption Referral to dermatology  7. Right upper quadrant abdominal pain Overall resolved per patient He declines further work -up today  as a [employer] for Neurosurgeon, PA.,have documented all relevant documentation on the behalf of [employer], PA,as directed by  Jarold Motto, PA while in the presence of Jarold Motto, Jarold Motto.  I, [place], Jarold Motto, have reviewed all documentation for this visit. The documentation on 07/30/21 for the exam, diagnosis, procedures, and orders are all  accurate and complete.   08/01/21,  PA-C

## 2021-07-30 NOTE — Patient Instructions (Addendum)
It was great to see you!  For your mood: Continue effexor 200 mg daily Restart 25 mg vistaril nightly for sleep  For your blood pressure: Start losartan 50 mg daily Start extended release propranolol 60 mg daily  For your ear infection: Start bactrim antibiotic twice daily x 7 days If worsening symptoms, I need to know  For your skin: Referral to dermatology  Let's follow-up in 1 month, sooner if you have concerns.  If a referral was placed today, you will be contacted for an appointment. Please note that routine referrals can sometimes take up to 3-4 weeks to process. Please call our office if you haven't heard anything after this time frame.  Take care,  Jarold Motto PA-C

## 2021-07-30 NOTE — Progress Notes (Deleted)
Jesse Cortez is a 65 y.o. male here for a {New prob or follow up:31724}.  SCRIBE STATEMENT  History of Present Illness:   Chief Complaint  Patient presents with   Growth behind right ear.    HPI  Past Medical History:  Diagnosis Date   Allergy    Anxiety    Arthritis    Blood transfusion without reported diagnosis 1968   Chronic kidney disease    kidney stones   COPD (chronic obstructive pulmonary disease) (HCC)    Depression    GERD (gastroesophageal reflux disease)    Hyperlipidemia    Hypertension    Neuromuscular disorder (HCC)    neuropathy   Sleep apnea      Social History   Tobacco Use   Smoking status: Every Day    Years: 40.00    Types: E-cigarettes, Cigarettes    Start date: 08/20/1980   Smokeless tobacco: Never   Tobacco comments:    up to 3PPD  Vaping Use   Vaping Use: Every day  Substance Use Topics   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   Drug use: No    Past Surgical History:  Procedure Laterality Date   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   COLONOSCOPY     KNEE SURGERY Left 1987, 2013   SHOULDER SURGERY Right 2010   orthoscopy   TONSILLECTOMY  1964   VASECTOMY      Family History  Problem Relation Age of Onset   Heart disease Mother    COPD Mother    Dementia Mother    Diabetes Mother    Diabetes Father     Allergies  Allergen Reactions   Bee Venom Anaphylaxis   Naproxen Anaphylaxis   Penicillin G Anaphylaxis   Doxycycline     Rash and itching    Drug Ingredient [Monosodium Glutamate] Other (See Comments)    Headache   Primidone     Other reaction(s): Unknown   Topiramate Other (See Comments)    "Petit mal seizures"     Current Medications:   Current Outpatient Medications:    propranolol (INDERAL) 20 MG tablet, Take 20 mg by mouth as needed., Disp: , Rfl:    venlafaxine (EFFEXOR) 100 MG tablet, Take 1 tablet (100 mg total) by mouth 2 (two) times daily., Disp: 180 tablet, Rfl: 1   aspirin  81 MG chewable tablet, Chew by mouth. (Patient not taking: Reported on 07/30/2021), Disp: , Rfl:    carbamazepine (TEGRETOL XR) 200 MG 12 hr tablet, Take 200 mg by mouth 2 (two) times daily. (Patient not taking: Reported on 07/30/2021), Disp: , Rfl:    clobetasol ointment (TEMOVATE) 0.05 %, Apply 1 application topically 2 (two) times daily. For 14 days. Repeat with recurrence / flare. (Patient not taking: Reported on 07/30/2021), Disp: 60 g, Rfl: 0   doxazosin (CARDURA) 2 MG tablet, Take by mouth. (Patient not taking: Reported on 07/30/2021), Disp: , Rfl:    hydrALAZINE (APRESOLINE) 50 MG tablet, Take 1 tablet (50 mg total) by mouth 3 (three) times daily., Disp: 270 tablet, Rfl: 3   hydrOXYzine (ATARAX/VISTARIL) 25 MG tablet, Take one 25 mg tablet nightly (Patient not taking: Reported on 07/30/2021), Disp: 90 tablet, Rfl: 1   modafinil (PROVIGIL) 200 MG tablet, Take by mouth., Disp: , Rfl:    omeprazole (PRILOSEC) 20 MG capsule, Take 20 mg by mouth daily., Disp: , Rfl:    pregabalin (LYRICA) 225 MG capsule, Take  225 mg by mouth 2 (two) times daily. (Patient not taking: Reported on 07/30/2021), Disp: , Rfl:    propranolol ER (INDERAL LA) 160 MG SR capsule, Take 1 capsule (160 mg total) by mouth every morning. (Patient not taking: Reported on 07/30/2021), Disp: 90 capsule, Rfl: 2   sildenafil (REVATIO) 20 MG tablet, TAKE 2 TABLETS BY MOUTH ONCE DAILY AS NEEDED 1  HOUR  BEFORE  INTERCOURSE (Patient not taking: Reported on 07/30/2021), Disp: , Rfl:    valsartan (DIOVAN) 160 MG tablet, Take 1 tablet (160 mg total) by mouth 2 (two) times daily. (Patient not taking: Reported on 07/30/2021), Disp: 180 tablet, Rfl: 3   Review of Systems:   ROS  Vitals:   Vitals:   07/30/21 1259  BP: (!) 170/110  Pulse: (!) 54  SpO2: 98%  Weight: 283 lb (128.4 kg)  Height: 6\' 2"  (1.88 m)     Body mass index is 36.34 kg/m.  Physical Exam:   Physical Exam  Assessment and Plan:   There are no diagnoses linked to this  encounter.     ***  , PA-C

## 2021-08-12 ENCOUNTER — Encounter: Payer: Self-pay | Admitting: Physician Assistant

## 2021-08-12 ENCOUNTER — Ambulatory Visit (INDEPENDENT_AMBULATORY_CARE_PROVIDER_SITE_OTHER): Payer: Medicare HMO | Admitting: Physician Assistant

## 2021-08-12 ENCOUNTER — Other Ambulatory Visit: Payer: Self-pay

## 2021-08-12 VITALS — BP 136/78 | HR 52 | Temp 97.6°F | Wt 285.0 lb

## 2021-08-12 DIAGNOSIS — L03213 Periorbital cellulitis: Secondary | ICD-10-CM

## 2021-08-12 MED ORDER — VENLAFAXINE HCL 100 MG PO TABS: 100.0000 mg | ORAL_TABLET | Freq: Two times a day (BID) | ORAL | 1 refills | Status: AC

## 2021-08-12 MED ORDER — SULFAMETHOXAZOLE-TRIMETHOPRIM 800-160 MG PO TABS: 1.0000 | ORAL_TABLET | Freq: Two times a day (BID) | ORAL | 0 refills | Status: DC

## 2021-08-12 NOTE — Progress Notes (Signed)
Jesse Cortez is a 65 y.o. male here for possible pink eye.  History of Present Illness:   Chief Complaint  Patient presents with   Conjunctivitis    Swelling and redness started yesterday    HPI  Eye Irritation Jesse Cortez has right eye irritation, swelling and redness that started yesterday. He has associated blurred vision, itchiness, and puffiness. Has some stabbing sensation at times.  He has some slight film on his eye that crusted over his eye this morning. Denies any fever, chills, nausea, vomiting, vision loss.    Past Medical History:  Diagnosis Date   Allergy    Anxiety    Arthritis    Blood transfusion without reported diagnosis 1968   Chronic kidney disease    kidney stones   COPD (chronic obstructive pulmonary disease) (HCC)    Depression    GERD (gastroesophageal reflux disease)    Hyperlipidemia    Hypertension    Neuromuscular disorder (HCC)    neuropathy   Sleep apnea      Social History   Tobacco Use   Smoking status: Every Day    Years: 40.00    Types: E-cigarettes, Cigarettes    Start date: 08/20/1980   Smokeless tobacco: Never   Tobacco comments:    up to 3PPD  Vaping Use   Vaping Use: Every day  Substance Use Topics   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   Drug use: No    Past Surgical History:  Procedure Laterality Date   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   COLONOSCOPY     KNEE SURGERY Left 1987, 2013   SHOULDER SURGERY Right 2010   orthoscopy   TONSILLECTOMY  1964   VASECTOMY      Family History  Problem Relation Age of Onset   Heart disease Mother    COPD Mother    Dementia Mother    Diabetes Mother    Diabetes Father     Allergies  Allergen Reactions   Bee Venom Anaphylaxis   Naproxen Anaphylaxis   Penicillin G Anaphylaxis   Doxycycline     Rash and itching    Drug Ingredient [Monosodium Glutamate] Other (See Comments)    Headache   Primidone     Other reaction(s): Unknown   Topiramate  Other (See Comments)    "Petit mal seizures"     Current Medications:   Current Outpatient Medications:    carbamazepine (TEGRETOL XR) 200 MG 12 hr tablet, Take 200 mg by mouth 2 (two) times daily., Disp: , Rfl:    hydrOXYzine (ATARAX/VISTARIL) 25 MG tablet, Take one 25 mg tablet nightly, Disp: 90 tablet, Rfl: 1   losartan (COZAAR) 50 MG tablet, Take 1 tablet (50 mg total) by mouth daily., Disp: 90 tablet, Rfl: 0   modafinil (PROVIGIL) 200 MG tablet, Take by mouth., Disp: , Rfl:    omeprazole (PRILOSEC) 20 MG capsule, Take 20 mg by mouth daily., Disp: , Rfl:    pregabalin (LYRICA) 225 MG capsule, Take 225 mg by mouth 2 (two) times daily., Disp: , Rfl:    propranolol ER (INDERAL LA) 60 MG 24 hr capsule, Take 1 capsule (60 mg total) by mouth daily., Disp: 90 capsule, Rfl: 0   aspirin 81 MG chewable tablet, Chew by mouth. (Patient not taking: Reported on 08/12/2021), Disp: , Rfl:    clobetasol ointment (TEMOVATE) 0.05 %, Apply 1 application topically 2 (two) times daily. For 14 days. Repeat with  recurrence / flare. (Patient not taking: Reported on 08/12/2021), Disp: 60 g, Rfl: 0   sildenafil (REVATIO) 20 MG tablet, TAKE 2 TABLETS BY MOUTH ONCE DAILY AS NEEDED 1  HOUR  BEFORE  INTERCOURSE (Patient not taking: Reported on 08/12/2021), Disp: , Rfl:    venlafaxine (EFFEXOR) 100 MG tablet, Take 1 tablet (100 mg total) by mouth 2 (two) times daily., Disp: 180 tablet, Rfl: 1   Review of Systems:   ROS Negative unless otherwise specified per HPI.  Vitals:   Vitals:   08/12/21 1424  BP: 136/78  Pulse: (!) 52  Temp: 97.6 F (36.4 C)  TempSrc: Temporal  SpO2: 97%  Weight: 285 lb (129.3 kg)     Body mass index is 36.59 kg/m.  Physical Exam:   Physical Exam Vitals and nursing note reviewed.  Constitutional:      Appearance: He is well-developed.  HENT:     Head: Normocephalic.  Eyes:     General: Vision grossly intact.     Extraocular Movements:     Right eye: Normal extraocular  motion.     Left eye: Normal extraocular motion.     Conjunctiva/sclera:     Right eye: Right conjunctiva is injected. Exudate present.     Pupils: Pupils are equal, round, and reactive to light.     Comments: Upper and lower right lid erythematous with mild swelling.  Pulmonary:     Effort: Pulmonary effort is normal.  Musculoskeletal:        General: Normal range of motion.     Cervical back: Normal range of motion.  Skin:    General: Skin is warm and dry.  Neurological:     Mental Status: He is alert and oriented to person, place, and time.  Psychiatric:        Behavior: Behavior normal.        Thought Content: Thought content normal.        Judgment: Judgment normal.    Assessment and Plan:   Preseptal cellulitis Suspect preseptal cellulitis He is no NAD and vitals stable in office today Start bactrim due to multiple abx allergies Low threshold to go to the ER if any worsening symptoms Cautioned on rash (SJS) with bactrim use Follow-up if lack of improvement  I,Havlyn C Ratchford,acting as a scribe for [employer], PA.,have documented all relevant documentation on the behalf of Jarold Motto, PA,as directed by  Jarold Motto, PA while in the presence of Jarold Motto, [place].  I, Jarold Motto, [place], have reviewed all documentation for this visit. The documentation on 08/12/21 for the exam, diagnosis, procedures, and orders are all accurate and complete.   Jarold Motto, PA-C

## 2021-08-12 NOTE — Patient Instructions (Signed)
It was great to see you!  Start bactrim  Beware that this medication can cause unusual rash. If you develop painful blistery rash, please stop medication and go to the ER.  Take care,  Jarold Motto PA-C

## 2021-09-01 ENCOUNTER — Other Ambulatory Visit: Payer: Self-pay

## 2021-09-01 ENCOUNTER — Encounter: Payer: Self-pay | Admitting: Physician Assistant

## 2021-09-01 ENCOUNTER — Ambulatory Visit (INDEPENDENT_AMBULATORY_CARE_PROVIDER_SITE_OTHER): Payer: Medicare HMO | Admitting: Physician Assistant

## 2021-09-01 VITALS — BP 130/90 | HR 68 | Temp 97.8°F | Ht 74.0 in | Wt 282.5 lb

## 2021-09-01 DIAGNOSIS — I10 Essential (primary) hypertension: Secondary | ICD-10-CM

## 2021-09-01 DIAGNOSIS — L03213 Periorbital cellulitis: Secondary | ICD-10-CM | POA: Diagnosis not present

## 2021-09-01 NOTE — Progress Notes (Signed)
Jesse Cortez is a 65 y.o. male here for a follow up of right eye irritation.   History of Present Illness:   Chief Complaint  Patient presents with   eye irritation    HPI  Pre-septal cellulitis Patient was seen by me on 08/12/21 for preseptal cellulitis. He was prescribed bactrim. Upon taking Bactrim DS 800-160 mg his symptoms have improved and he is no longer having problems.   Elevated Blood Pressure Currently compliant with taking losartan 50 mg, propranolol er 60 mg dailywith no adverse effects.  Denies chest pain, SOB, blurred vision, dizziness,or unusual headaches.He is managing well.   BP Readings from Last 3 Encounters:  09/01/21 130/90  08/12/21 136/78  07/30/21 (!) 170/110    Past Medical History:  Diagnosis Date   Allergy    Anxiety    Arthritis    Blood transfusion without reported diagnosis 1968   Chronic kidney disease    kidney stones   COPD (chronic obstructive pulmonary disease) (HCC)    Depression    GERD (gastroesophageal reflux disease)    Hyperlipidemia    Hypertension    Neuromuscular disorder (HCC)    neuropathy   Sleep apnea      Social History   Tobacco Use   Smoking status: Every Day    Years: 40.00    Types: E-cigarettes, Cigarettes    Start date: 08/20/1980   Smokeless tobacco: Never   Tobacco comments:    up to 3PPD  Vaping Use   Vaping Use: Every day  Substance Use Topics   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   Drug use: No    Past Surgical History:  Procedure Laterality Date   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   COLONOSCOPY     KNEE SURGERY Left 1987, 2013   SHOULDER SURGERY Right 2010   orthoscopy   TONSILLECTOMY  1964   VASECTOMY      Family History  Problem Relation Age of Onset   Heart disease Mother    COPD Mother    Dementia Mother    Diabetes Mother    Diabetes Father     Allergies  Allergen Reactions   Bee Venom Anaphylaxis   Naproxen Anaphylaxis   Penicillin G  Anaphylaxis   Doxycycline     Rash and itching    Drug Ingredient [Monosodium Glutamate] Other (See Comments)    Headache   Primidone     Other reaction(s): Unknown   Topiramate Other (See Comments)    "Petit mal seizures"     Current Medications:   Current Outpatient Medications:    carbamazepine (TEGRETOL XR) 200 MG 12 hr tablet, Take 200 mg by mouth 2 (two) times daily., Disp: , Rfl:    hydrOXYzine (ATARAX/VISTARIL) 25 MG tablet, Take one 25 mg tablet nightly, Disp: 90 tablet, Rfl: 1   losartan (COZAAR) 50 MG tablet, Take 1 tablet (50 mg total) by mouth daily., Disp: 90 tablet, Rfl: 0   omeprazole (PRILOSEC) 20 MG capsule, Take 20 mg by mouth daily., Disp: , Rfl:    propranolol ER (INDERAL LA) 60 MG 24 hr capsule, Take 1 capsule (60 mg total) by mouth daily., Disp: 90 capsule, Rfl: 0   venlafaxine (EFFEXOR) 100 MG tablet, Take 1 tablet (100 mg total) by mouth 2 (two) times daily., Disp: 180 tablet, Rfl: 1   aspirin 81 MG chewable tablet, Chew by mouth. (Patient not taking: No sig reported), Disp: , Rfl:  modafinil (PROVIGIL) 200 MG tablet, Take by mouth. (Patient not taking: Reported on 09/01/2021), Disp: , Rfl:    pregabalin (LYRICA) 225 MG capsule, Take 225 mg by mouth 2 (two) times daily. (Patient not taking: Reported on 09/01/2021), Disp: , Rfl:    sildenafil (REVATIO) 20 MG tablet, TAKE 2 TABLETS BY MOUTH ONCE DAILY AS NEEDED 1  HOUR  BEFORE  INTERCOURSE (Patient not taking: No sig reported), Disp: , Rfl:    Review of Systems:   ROS Negative unless otherwise specified per HPI.  Vitals:   Vitals:   09/01/21 1317  BP: 130/90  Pulse: 68  Temp: 97.8 F (36.6 C)  TempSrc: Temporal  Weight: 282 lb 8 oz (128.1 kg)  Height: 6\' 2"  (1.88 m)     Body mass index is 36.27 kg/m.  Physical Exam:   Physical Exam Vitals and nursing note reviewed.  Constitutional:      General: He is not in acute distress.    Appearance: He is well-developed. He is not ill-appearing or  toxic-appearing.  Cardiovascular:     Rate and Rhythm: Normal rate and regular rhythm.     Pulses: Normal pulses.     Heart sounds: Normal heart sounds, S1 normal and S2 normal.  Pulmonary:     Effort: Pulmonary effort is normal.     Breath sounds: Normal breath sounds.  Skin:    General: Skin is warm and dry.  Neurological:     Mental Status: He is alert.     GCS: GCS eye subscore is 4. GCS verbal subscore is 5. GCS motor subscore is 6.  Psychiatric:        Speech: Speech normal.        Behavior: Behavior normal. Behavior is cooperative.    Assessment and Plan:   Preseptal cellulitis Resolved Return to office any further concerns  Essential hypertension Much improved from last HTN visit Continue losartan 50 mg daily and propranolol ER 60 mg daily Follow-up in 6 months for labs, sooner if concerns  I,Havlyn C Ratchford,acting as a scribe for , PA.,have documented all relevant documentation on the behalf of [employer], PA,as directed by  Jarold Motto, PA while in the presence of Jarold Motto, Jarold Motto.  I, [place], Jarold Motto, have reviewed all documentation for this visit. The documentation on 09/01/21 for the exam, diagnosis, procedures, and orders are all accurate and complete.   09/03/21, PA-C

## 2021-09-07 NOTE — Telephone Encounter (Signed)
This concern has been previously addressed by myself and/or another provider.  If they patient has ongoing concerns, they can contact me at their convenience.  Thank you,  Rich Havyn Ramo, NP 

## 2022-01-29 ENCOUNTER — Encounter: Payer: Self-pay | Admitting: Physician Assistant

## 2022-02-06 ENCOUNTER — Ambulatory Visit: Payer: Medicare HMO

## 2022-02-19 ENCOUNTER — Ambulatory Visit: Payer: Medicare HMO

## 2022-03-02 ENCOUNTER — Ambulatory Visit: Payer: Medicare HMO | Admitting: Physician Assistant

## 2022-03-26 ENCOUNTER — Other Ambulatory Visit: Payer: Self-pay

## 2022-04-06 ENCOUNTER — Telehealth: Payer: Medicare HMO | Admitting: Emergency Medicine

## 2022-04-06 DIAGNOSIS — H1132 Conjunctival hemorrhage, left eye: Secondary | ICD-10-CM

## 2022-04-06 DIAGNOSIS — J302 Other seasonal allergic rhinitis: Secondary | ICD-10-CM

## 2022-04-06 NOTE — Progress Notes (Signed)
Virtual Visit Consent   Jesse HohJoseph Howard Daudelin, you are scheduled for a virtual visit with a [facility] provider today. Just as with appointments in the office, your consent must be obtained to participate. Your consent will be active for this visit and any virtual visit you may have with one of our providers in the next 365 days. If you have a MyChart account, a copy of this consent can be sent to you electronically.  As this is a virtual visit, video technology does not allow for your provider to perform a traditional examination. This may limit your provider's ability to fully assess your condition. If your provider identifies any concerns that need to be evaluated in person or the need to arrange testing (such as labs, EKG, etc.), we will make arrangements to do so. Although advances in technology are sophisticated, we cannot ensure that it will always work on either your end or our end. If the connection with a video visit is poor, the visit may have to be switched to a telephone visit. With either a video or telephone visit, we are not always able to ensure that we have a secure connection.  By engaging in this virtual visit, you consent to the provision of healthcare and authorize for your insurance to be billed (if applicable) for the services provided during this visit. Depending on your insurance coverage, you may receive a charge related to this service.  I need to obtain your verbal consent now. Are you willing to proceed with your visit today? Jesse DunnJoseph Howard Hereford has provided verbal consent on 04/06/2022 for a virtual visit (video or telephone). Cathlyn ParsonsAngela M Princessa Lesmeister, NP  Date: 04/06/2022 11:06 AM  Virtual Visit via Video Note   I, Cathlyn ParsonsAngela M Liliani Bobo, connected with  Jesse DunnJoseph Howard Cortez  ([id number], 08/07/1956) on 04/06/22 at 11:00 AM EDT by a video-enabled telemedicine application and verified that I am speaking with the correct person using two identifiers.  Location: Patient: Virtual Visit  Location Patient: Home Provider: Virtual Visit Location Provider: Home Office   I discussed the limitations of evaluation and management by telemedicine and the availability of in person appointments. The patient expressed understanding and agreed to proceed.    History of Present Illness: Jesse Cortez is a 66 y.o. who identifies as a male who was assigned male at birth, and is being seen today for eye redness.  Patient reports his daughter pointed out that he had a red eye yesterday.  Looking in the mirror, patient does report that he has left eye redness in the white part of the eye underneath the pupil and to the medial side.  He denies any specific injury.  He denies eye drainage or change in vision.  He does report his eyes have been itchy and he is having trouble with seasonal allergies.  He has been blowing his nose.   HPI: HPI  Problems:  Patient Active Problem List   Diagnosis Date Noted   Blurred vision, left eye 10/15/2019   Hyperlipidemia 09/06/2019   Low testosterone 09/06/2019   Polyp of colon 09/06/2019   Former cigarette smoker 12/30/2018   PTSD (post-traumatic stress disorder) 10/20/2018   Tremor 07/01/2018   Pain in joints of right hand 04/27/2018   Nephrolithiasis 04/19/2018   Traumatic partial tear of left biceps tendon 03/17/2018   Cubital tunnel syndrome of both upper extremities 02/07/2018   Hepatic cyst 02/07/2018   H/O cardiac catheterization 02/07/2018   Incomplete tear of left rotator cuff 02/07/2018  Renal cyst, left 02/07/2018   Chronic thoracic spine pain (Primary Area of Pain) 02/02/2018   Chronic neck pain (Secondary Area of Pain) (R>L) 02/02/2018   Chronic pain of left upper extremity 02/02/2018   Chronic elbow pain, left [facility] Area of Pain) 02/02/2018   Chronic pain of both shoulders (Fourth Area of Pain) (L>R) 02/02/2018   Chronic pain of both knees (L>R) 02/02/2018   Chronic hip pain, left 02/02/2018   Chronic groin pain, right  02/02/2018   Disorder of skeletal system 02/02/2018   Left elbow pain 02/01/2018   Benign essential tremor 01/20/2018   Imbalance 01/20/2018   Chronic bilateral thoracic back pain 01/10/2018   H/O sepsis 01/10/2018   Spinal stenosis of lumbar region 01/10/2018   COPD (chronic obstructive pulmonary disease) with chronic bronchitis (HCC) 01/04/2018   Essential hypertension 01/04/2018   Gastroesophageal reflux disease without esophagitis 01/04/2018   H/O brain tumor 01/04/2018   Idiopathic peripheral neuropathy 01/04/2018   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 (Patient not taking: No sig reported), Disp: , Rfl:    carbamazepine (TEGRETOL XR) 200 MG 12 hr tablet, Take 200 mg by mouth 2 (two) times daily., Disp: , Rfl:    hydrOXYzine (ATARAX/VISTARIL) 25 MG tablet, Take one 25 mg tablet nightly, Disp: 90 tablet, Rfl: 1   losartan (COZAAR) 50 MG tablet, Take 1 tablet (50 mg total) by mouth daily., Disp: 90 tablet, Rfl: 0   modafinil (PROVIGIL) 200 MG tablet, Take by mouth. (Patient not taking: Reported on 09/01/2021), Disp: , Rfl:    omeprazole (PRILOSEC) 20 MG capsule, Take 20 mg by mouth daily., Disp: , Rfl:    pregabalin (LYRICA) 225 MG capsule, Take 225 mg by mouth 2 (two) times daily. (Patient not taking: Reported on 09/01/2021), Disp: , Rfl:    propranolol ER (INDERAL LA) 60 MG 24 hr capsule, Take 1 capsule (60 mg total) by mouth daily., Disp: 90 capsule, Rfl: 0    sildenafil (REVATIO) 20 MG tablet, TAKE 2 TABLETS BY MOUTH ONCE DAILY AS NEEDED 1  HOUR  BEFORE  INTERCOURSE (Patient not taking: No sig reported), Disp: , Rfl:    venlafaxine (EFFEXOR) 100 MG tablet, Take 1 tablet (100 mg total) by mouth 2 (two) times daily., Disp: 180 tablet, Rfl: 1  Observations/Objective: Patient is well-developed, well-nourished in no acute distress.  Resting comfortably  at home.  Head is normocephalic, atraumatic.  Patient had his blood from subconjunctival hemorrhage in the left eye.  No drainage or conjunctival injection noted in either eye No labored breathing.  Speech is clear and coherent with logical content.  Patient is alert and oriented at baseline.    Assessment and Plan: 1. Subconjunctival hemorrhage of left eye  2. Seasonal allergies  Explained normal course of healing for subconjunctival hemorrhage.  Also discussed over-the-counter treatments for seasonal allergies from allergic rhinitis as well as allergic conjunctivitis since his eyes are  itching.  Follow Up Instructions: I discussed the assessment and treatment plan with the patient. The patient was provided an opportunity to ask questions and all were answered. The patient agreed with the plan and demonstrated an understanding of the instructions.  A copy of instructions were sent to the patient via MyChart unless otherwise noted below.   The patient was advised to call back or seek an in-person evaluation if the symptoms worsen or if the condition fails to improve as anticipated.  Time:  I spent 7 minutes with the patient via telehealth technology discussing the above problems/concerns.    Cathlyn Parsons, NP

## 2022-04-06 NOTE — Patient Instructions (Signed)
  Jesse [place]ers, thank you for joining Carvel Getting, NP for today's virtual visit.  While this provider is not your primary care provider (PCP), if your PCP is located in our provider database this encounter information will be shared with them immediately following your visit.  Consent: (Patient) Jesse Cortez provided verbal consent for this virtual visit at the beginning of the encounter.  Current Medications:  Current Outpatient Medications:    aspirin 81 MG chewable tablet, Chew by mouth. (Patient not taking: No sig reported), Disp: , Rfl:    carbamazepine (TEGRETOL XR) 200 MG 12 hr tablet, Take 200 mg by mouth 2 (two) times daily., Disp: , Rfl:    hydrOXYzine (ATARAX/VISTARIL) 25 MG tablet, Take one 25 mg tablet nightly, Disp: 90 tablet, Rfl: 1   losartan (COZAAR) 50 MG tablet, Take 1 tablet (50 mg total) by mouth daily., Disp: 90 tablet, Rfl: 0   modafinil (PROVIGIL) 200 MG tablet, Take by mouth. (Patient not taking: Reported on 09/01/2021), Disp: , Rfl:    omeprazole (PRILOSEC) 20 MG capsule, Take 20 mg by mouth daily., Disp: , Rfl:    pregabalin (LYRICA) 225 MG capsule, Take 225 mg by mouth 2 (two) times daily. (Patient not taking: Reported on 09/01/2021), Disp: , Rfl:    propranolol ER (INDERAL LA) 60 MG 24 hr capsule, Take 1 capsule (60 mg total) by mouth daily., Disp: 90 capsule, Rfl: 0   sildenafil (REVATIO) 20 MG tablet, TAKE 2 TABLETS BY MOUTH ONCE DAILY AS NEEDED 1  HOUR  BEFORE  INTERCOURSE (Patient not taking: No sig reported), Disp: , Rfl:    venlafaxine (EFFEXOR) 100 MG tablet, Take 1 tablet (100 mg total) by mouth 2 (two) times daily., Disp: 180 tablet, Rfl: 1   Medications ordered in this encounter:  No orders of the defined types were placed in this encounter.    *If you need refills on other medications prior to your next appointment, please contact your pharmacy*  Follow-Up: Call back or seek an in-person evaluation if the symptoms worsen or if the  condition fails to improve as anticipated.  Other Instructions The red part of your eye from bleeding under the outer layer of the eye will go away on its own.  For your allergies, you can try using over-the-counter allergy eyedrops and allergy medicines such as Claritin (generic loratadine okay to use) and Flonase.   If you have been instructed to have an in-person evaluation today at a local Urgent Care facility, please use the link below. It will take you to a list of all of our available [facility], including address, phone number and hours of operation. Please do not delay care.  [facility]  If you or a family member do not have a primary care provider, use the link below to schedule a visit and establish care. When you choose a [facility] primary care physician or advanced practice provider, you gain a long-term partner in health. Find a Primary Care Provider  Learn more about [facility]'s in-office and virtual care options: [facility]

## 2022-04-20 ENCOUNTER — Ambulatory Visit: Payer: Medicare HMO | Admitting: Physician Assistant

## 2022-05-01 ENCOUNTER — Ambulatory Visit (INDEPENDENT_AMBULATORY_CARE_PROVIDER_SITE_OTHER): Payer: Medicare HMO | Admitting: Physician Assistant

## 2022-05-01 ENCOUNTER — Telehealth: Payer: Self-pay

## 2022-05-01 ENCOUNTER — Ambulatory Visit (HOSPITAL_BASED_OUTPATIENT_CLINIC_OR_DEPARTMENT_OTHER)
Admission: RE | Admit: 2022-05-01 | Discharge: 2022-05-01 | Disposition: A | Discharge status: Home / Self Care | Payer: Medicare HMO | Source: Ambulatory Visit | Attending: Physician Assistant | Admitting: Physician Assistant

## 2022-05-01 ENCOUNTER — Encounter: Payer: Self-pay | Admitting: Physician Assistant

## 2022-05-01 ENCOUNTER — Encounter (HOSPITAL_BASED_OUTPATIENT_CLINIC_OR_DEPARTMENT_OTHER): Payer: Self-pay

## 2022-05-01 VITALS — BP 170/100 | HR 59 | Temp 98.0°F | Ht 74.0 in | Wt 251.0 lb

## 2022-05-01 DIAGNOSIS — D7389 Other diseases of spleen: Secondary | ICD-10-CM | POA: Diagnosis not present

## 2022-05-01 DIAGNOSIS — R1033 Periumbilical pain: Secondary | ICD-10-CM

## 2022-05-01 DIAGNOSIS — K5730 Diverticulosis of large intestine without perforation or abscess without bleeding: Secondary | ICD-10-CM | POA: Diagnosis not present

## 2022-05-01 LAB — POCT I-STAT CREATININE: Creatinine, Ser: 0.8 mg/dL (ref 0.61–1.24)

## 2022-05-01 IMAGING — CT CT ABD-PELV W/ CM
2 of 5 series · 16 of 46 positions shown, 18 images · IV contrast (APPLIED)
Comparison: MR abdomen [DATE]

CLINICAL DATA: Abdominal pain, acute, nonlocalized. Patient states
sharp pain near the umbilicus. 30 pound unintentional weight loss
over the last 6 months.

EXAM:
CT ABDOMEN AND PELVIS WITH CONTRAST
TECHNIQUE: Multidetector CT imaging of the abdomen and pelvis was performed
using the standard protocol following bolus administration of
intravenous contrast.

[Series 2: abd pel w · axial · 0.85mm/px · z∈[-527,-92]mm · 13 of 99 slices shown, 15 images]
[im 6/99  soft-tissue]
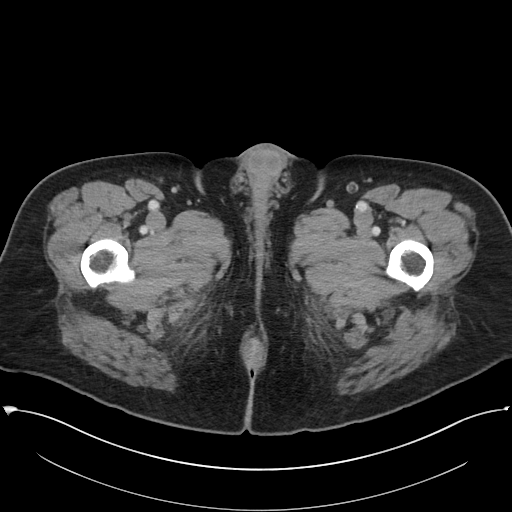
[im 6/99  bone]
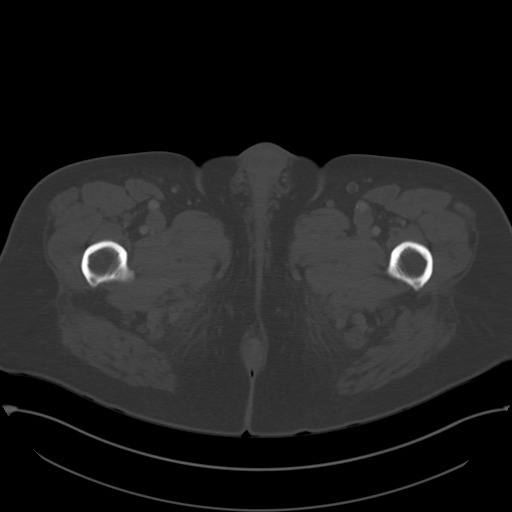
[im 16/99  soft-tissue]
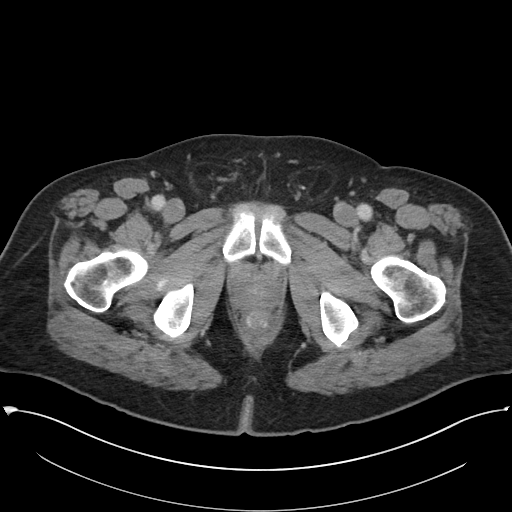
[im 21/99  soft-tissue]
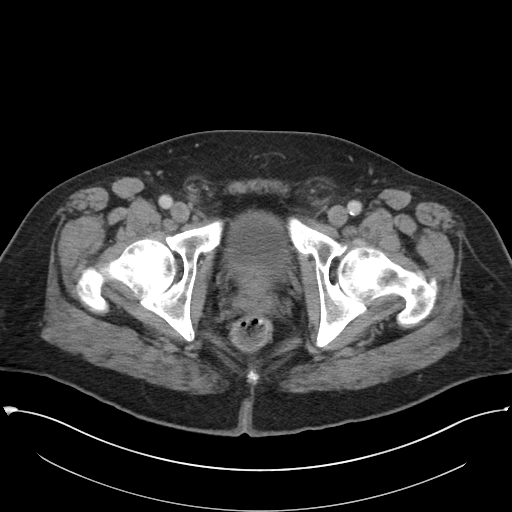
[im 26/99  soft-tissue]
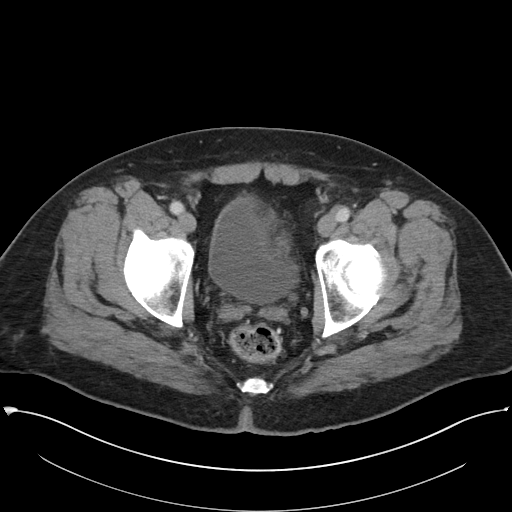
[im 37/99  soft-tissue]
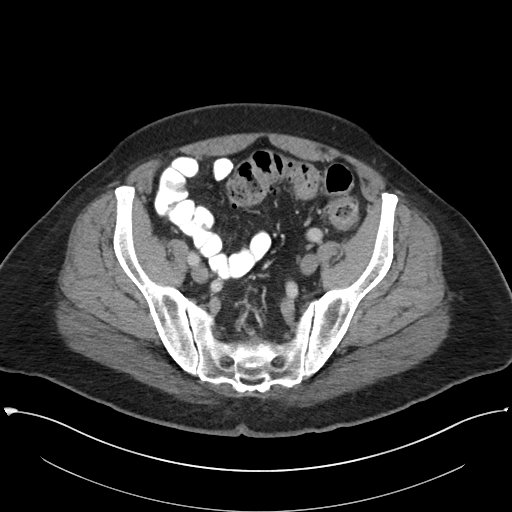
[im 42/99  soft-tissue]
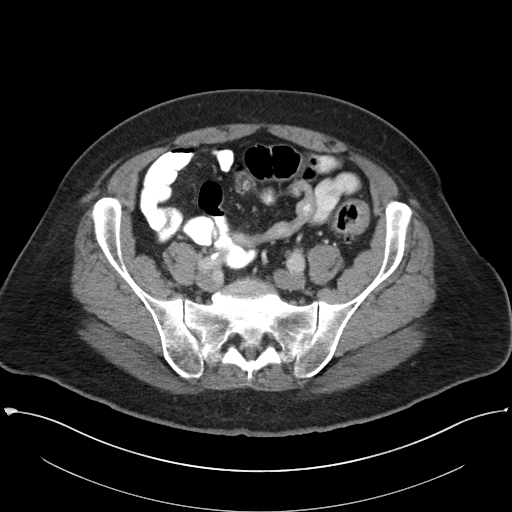
[im 52/99  soft-tissue]
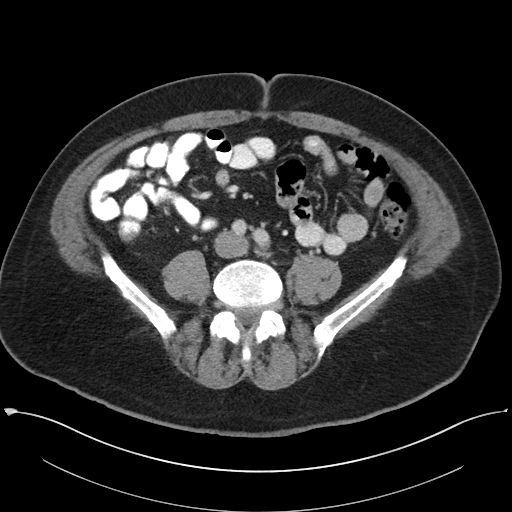
[im 57/99  soft-tissue]
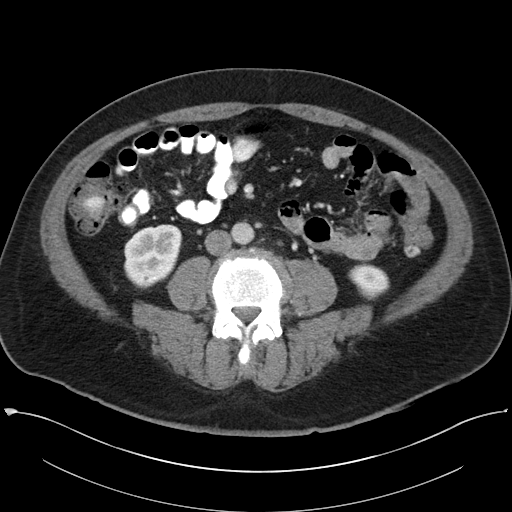
[im 62/99  soft-tissue]
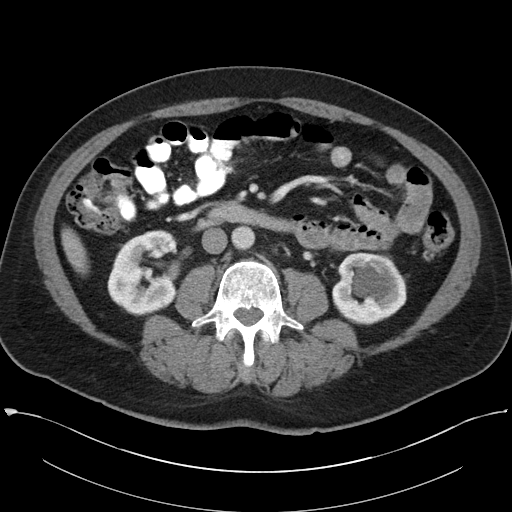
[im 62/99  bone]
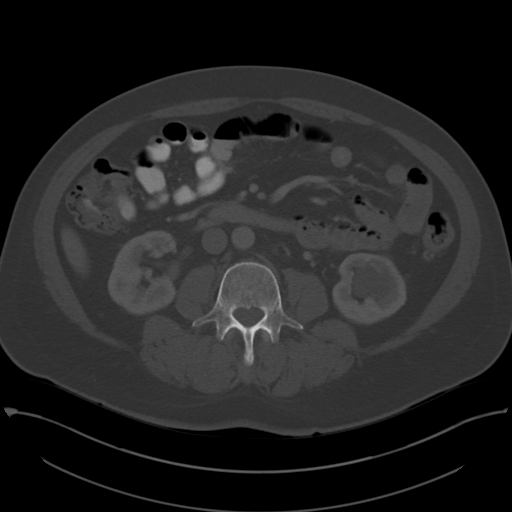
[im 73/99  soft-tissue]
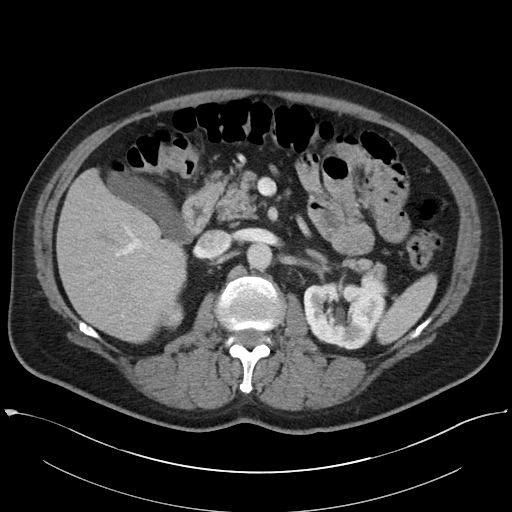
[im 78/99  soft-tissue]
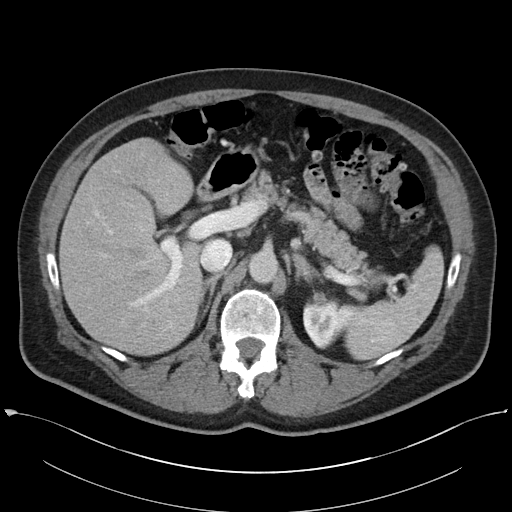
[im 83/99  soft-tissue]
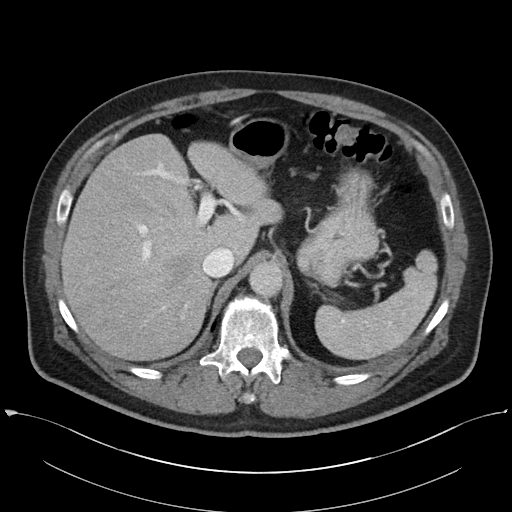
[im 93/99  soft-tissue]
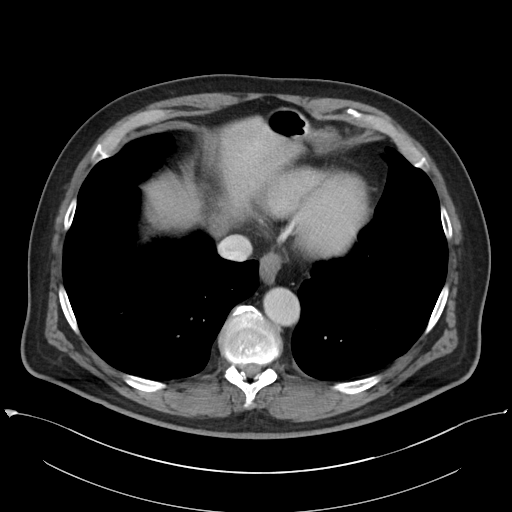

[Series 5: coronal · coronal · 0.86mm/px · 3 of 108 slices shown]
[im 36/108  soft-tissue]
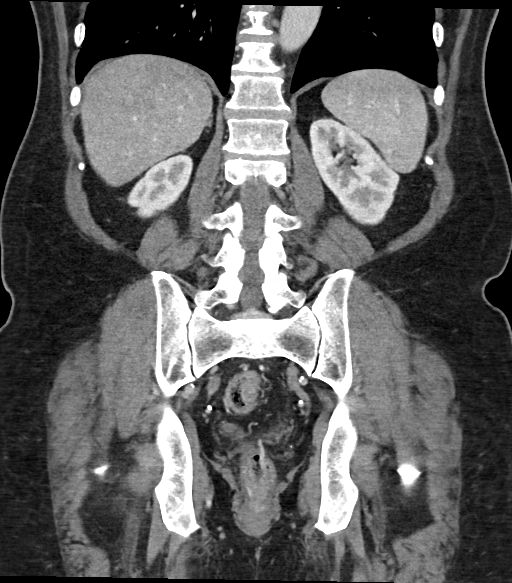
[im 48/108  soft-tissue]
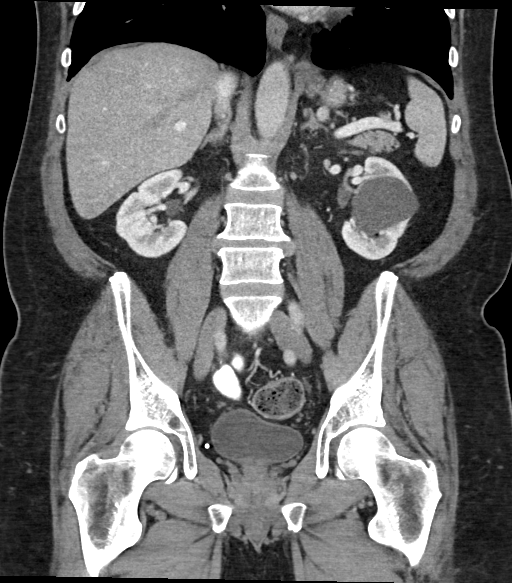
[im 60/108  soft-tissue]
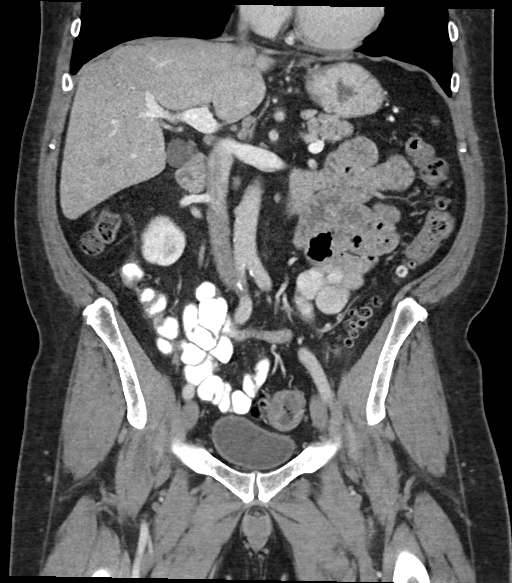

[16 of 46 positions shown; findings below may reference images not displayed]

RADIATION DOSE REDUCTION: This exam was performed according to the
departmental dose-optimization program which includes automated
exposure control, adjustment of the mA and/or kV according to
patient size and/or use of iterative reconstruction technique.

CONTRAST:  100mL OMNIPAQUE IOHEXOL 300 MG/ML  SOLN
FINDINGS: Lower chest: Minimal dependent atelectasis is present at the lung
bases. Lungs are otherwise clear. Heart size is normal. No
significant pleural or pericardial effusion is present.

Hepatobiliary: No focal liver abnormality is seen. No gallstones,
gallbladder wall thickening, or biliary dilatation.

Pancreas: Unremarkable. No pancreatic ductal dilatation or
surrounding inflammatory changes.

Spleen: Granulomata are present within the spleen. Spleen size is
normal.

Adrenals/Urinary Tract: The adrenal glands are normal bilaterally.
Simple cyst of the left kidney measures up to 5 cm. Recommend no
follow-up. No other renal lesions are present. No stone or mass
lesion is present. Ureters are within normal limits. The urinary
bladder is normal.

Stomach/Bowel: The stomach and duodenum are within normal limits.
Small bowel is unremarkable. Terminal ileum is normal. Appendix is
visualized and normal. Ascending and transverse colon are within
normal limits. Diverticular changes are present within the
descending and sigmoid colon without inflammation to suggest
diverticulitis.

Vascular/Lymphatic: Atherosclerotic changes are present in the aorta
and branch vessels without aneurysm. No significant adenopathy is
present.

Reproductive: Prostate is unremarkable.

Other: No abdominal wall hernia or abnormality. No abdominopelvic
ascites. No significant paraumbilical hernia is present.

Musculoskeletal: Disc disease is greatest at L3-4 with central and
bilateral foraminal stenosis. Foraminal narrowing is also present on
the left at L4-5 and to lesser extent at L5-S1. Superior endplate
Schmorl's node is present at T12. Vertebral body heights are
otherwise maintained. Bony pelvis is normal. The hips are located
and within normal limits. No focal osseous lesions are present.
IMPRESSION: 1. No acute or focal lesion to explain the patient's symptoms.
2. Descending and sigmoid diverticulosis without diverticulitis.
3. Aortic Atherosclerosis ([BI]-[BI]).

## 2022-05-01 MED ORDER — LOSARTAN POTASSIUM 50 MG PO TABS: 50.0000 mg | ORAL_TABLET | Freq: Every day | ORAL | 0 refills | Status: AC

## 2022-05-01 MED ORDER — IOHEXOL 300 MG/ML  SOLN
100.0000 mL | Freq: Once | INTRAMUSCULAR | Status: AC | PRN
  Administered 2022-05-01: 100 mL via INTRAVENOUS

## 2022-05-01 NOTE — Patient Instructions (Signed)
It was great to see you!  Please restart losartan for your blood pressure  We are getting blood work and urine studies for your symptoms  We are also getting a CT scan.   If any worsening symptoms in the meantime, please go to the ER.  Take care,  Jarold Motto PA-C

## 2022-05-01 NOTE — Progress Notes (Signed)
Jesse Cortez is a 66 y.o. male here for a new problem.  History of Present Illness:   Chief Complaint  Patient presents with  . Abdominal Pain    Pt c/o sharp pain right side of abdomen next to navel x 4 days.    HPI  Abdominal pain Recently helped his daughter move. Does know of kidney stone making its way through on the left side -- hx of these and symptoms are similar.   Having sharp pain in his abdomen near his umbilicus. Has been going on for at least 4 days. Symptoms are constant but worsen with certain movement. Denies constipation and diarrhea. He does admit to at least 30 lb of unintentional weight loss x 6 months.   Denies: fever, chills, night sweats, changes to bowels. He was seen by GI in March 2022 and they were going to proceed with scheduling colonoscopy after receiving records from prior colonoscopy but this has not happened yet from what I can tell.    Wt Readings from Last 3 Encounters:  05/01/22 251 lb (113.9 kg)  09/01/21 282 lb 8 oz (128.1 kg)  08/12/21 285 lb (129.3 kg)   .  Past Medical History:  Diagnosis Date  . Allergy   . Anxiety   . Arthritis   . Blood transfusion without reported diagnosis 1968  . Chronic kidney disease    kidney stones  . COPD (chronic obstructive pulmonary disease) (HCC)   . Depression   . GERD (gastroesophageal reflux disease)   . Hyperlipidemia   . Hypertension   . Neuromuscular disorder (HCC)    neuropathy  . Sleep apnea      Social History   Tobacco Use  . Smoking status: Every Day    Years: 40.00    Types: E-cigarettes, Cigarettes    Start date: 08/20/1980  . Smokeless tobacco: Never  . Tobacco comments:    up to 3PPD  Vaping Use  . Vaping Use: Every day  Substance Use Topics  . 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 Drug use: No    Past Surgical History:  Procedure Laterality Date  . 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 COLONOSCOPY    . KNEE SURGERY Left 1987, 2013  .  SHOULDER SURGERY Right 2010   orthoscopy  . TONSILLECTOMY  1964  . VASECTOMY      Family History  Problem Relation Age of Onset  . Heart disease Mother   . COPD Mother   . Dementia Mother   . Diabetes Mother   . Diabetes Father     Allergies  Allergen Reactions  . Bee Venom Anaphylaxis  . Naproxen Anaphylaxis  . Penicillin G Anaphylaxis  . Doxycycline     Rash and itching   . Drug Ingredient [Monosodium Glutamate] Other (See Comments)    Headache  . Primidone     Other reaction(s): Unknown  . Topiramate Other (See Comments)    "Petit mal seizures"     Current Medications:   Current Outpatient Medications:  .  OVER THE COUNTER MEDICATION, Zyn Nicotine Pouches 6 mg, Disp: , Rfl:  .  propranolol (INDERAL) 10 MG tablet, Take 10 mg by mouth daily in the afternoon., Disp: , Rfl:  .  venlafaxine (EFFEXOR) 100 MG tablet, Take 1 tablet (100 mg total) by mouth 2 (two) times daily., Disp: 180 tablet, Rfl: 1 .  carbamazepine (TEGRETOL XR) 200 MG 12 hr tablet, Take 200  mg by mouth 2 (two) times daily. (Patient not taking: Reported on 05/01/2022), Disp: , Rfl:  .  hydrOXYzine (ATARAX/VISTARIL) 25 MG tablet, Take one 25 mg tablet nightly (Patient not taking: Reported on 05/01/2022), Disp: 90 tablet, Rfl: 1 .  losartan (COZAAR) 50 MG tablet, Take 1 tablet (50 mg total) by mouth daily. (Patient not taking: Reported on 05/01/2022), Disp: 90 tablet, Rfl: 0 .  modafinil (PROVIGIL) 200 MG tablet, Take by mouth. (Patient not taking: Reported on 05/01/2022), Disp: , Rfl:  .  omeprazole (PRILOSEC) 20 MG capsule, Take 20 mg by mouth daily. (Patient not taking: Reported on 05/01/2022), Disp: , Rfl:  .  pregabalin (LYRICA) 225 MG capsule, Take 225 mg by mouth 2 (two) times daily. (Patient not taking: Reported on 09/01/2021), Disp: , Rfl:  .  propranolol ER (INDERAL LA) 60 MG 24 hr capsule, Take 1 capsule (60 mg total) by mouth daily. (Patient not taking: Reported on 05/01/2022), Disp: 90 capsule, Rfl: 0 .   sildenafil (REVATIO) 20 MG tablet, TAKE 2 TABLETS BY MOUTH ONCE DAILY AS NEEDED 1  HOUR  BEFORE  INTERCOURSE (Patient not taking: Reported on 08/12/2021), Disp: , Rfl:    Review of Systems:   ROS Negative unless otherwise specified per HPI.   Vitals:   Vitals:   05/01/22 1425  BP: (!) 160/100  Pulse: 63  Temp: 98 F (36.7 C)  TempSrc: Temporal  SpO2: 95%  Weight: 251 lb (113.9 kg)  Height: 6\' 2"  (1.88 m)     Body mass index is 32.23 kg/m.  Physical Exam:   Physical Exam Vitals and nursing note reviewed.  Constitutional:      General: He is not in acute distress.    Appearance: He is well-developed. He is not ill-appearing or toxic-appearing.  Cardiovascular:     Rate and Rhythm: Normal rate and regular rhythm.     Pulses: Normal pulses.     Heart sounds: Normal heart sounds, S1 normal and S2 normal.  Pulmonary:     Effort: Pulmonary effort is normal.     Breath sounds: Normal breath sounds.  Abdominal:     General: Abdomen is flat. Bowel sounds are normal.     Palpations: Abdomen is soft.     Tenderness: There is abdominal tenderness in the periumbilical area. There is guarding and rebound. There is no right CVA tenderness or left CVA tenderness.  Skin:    General: Skin is warm and dry.  Neurological:     Mental Status: He is alert.     GCS: GCS eye subscore is 4. GCS verbal subscore is 5. GCS motor subscore is 6.  Psychiatric:        Speech: Speech normal.        Behavior: Behavior normal. Behavior is cooperative.     Assessment and Plan:   Periumbilical abdominal pain Abdomen is quite tender on exam Due to unintentional weight loss and significant abdominal tenderness, will obtain blood work, UA and CT abd/pel to r/o mass, SBO, pancreatitis, diverticulitis, among others If worsening sx in the meantime, he was instructed to to go the ER  , PA-C

## 2022-05-01 NOTE — Telephone Encounter (Signed)
Lvm for pt to see if pt can come in for his 3pm visit instead of a VV. Let pt know there is not much she can do through virtual.

## 2022-05-02 LAB — COMPREHENSIVE METABOLIC PANEL
AG Ratio: 1.5 (calc) (ref 1.0–2.5)
ALT: 10 U/L (ref 9–46)
AST: 11 U/L (ref 10–35)
Albumin: 4 g/dL (ref 3.6–5.1)
Alkaline phosphatase (APISO): 87 U/L (ref 35–144)
BUN: 9 mg/dL (ref 7–25)
CO2: 26 mmol/L (ref 20–32)
Calcium: 9 mg/dL (ref 8.6–10.3)
Chloride: 104 mmol/L (ref 98–110)
Creat: 0.9 mg/dL (ref 0.70–1.35)
Globulin: 2.7 g/dL (calc) (ref 1.9–3.7)
Glucose, Bld: 126 mg/dL — ABNORMAL HIGH (ref 65–99)
Potassium: 4.4 mmol/L (ref 3.5–5.3)
Sodium: 140 mmol/L (ref 135–146)
Total Bilirubin: 0.3 mg/dL (ref 0.2–1.2)
Total Protein: 6.7 g/dL (ref 6.1–8.1)

## 2022-05-02 LAB — URINALYSIS, ROUTINE W REFLEX MICROSCOPIC
Bilirubin Urine: NEGATIVE
Glucose, UA: NEGATIVE
Hgb urine dipstick: NEGATIVE
Ketones, ur: NEGATIVE
Leukocytes,Ua: NEGATIVE
Nitrite: NEGATIVE
Protein, ur: NEGATIVE
Specific Gravity, Urine: 1.016 (ref 1.001–1.035)
pH: 5.5 (ref 5.0–8.0)

## 2022-05-02 LAB — CBC WITH DIFFERENTIAL/PLATELET
Absolute Monocytes: 628 cells/uL (ref 200–950)
Basophils Absolute: 31 cells/uL (ref 0–200)
Basophils Relative: 0.5 %
Eosinophils Absolute: 110 cells/uL (ref 15–500)
Eosinophils Relative: 1.8 %
HCT: 43.4 % (ref 38.5–50.0)
Hemoglobin: 14.9 g/dL (ref 13.2–17.1)
Lymphs Abs: 1098 cells/uL (ref 850–3900)
MCH: 32 pg (ref 27.0–33.0)
MCHC: 34.3 g/dL (ref 32.0–36.0)
MCV: 93.3 fL (ref 80.0–100.0)
MPV: 10.1 fL (ref 7.5–12.5)
Monocytes Relative: 10.3 %
Neutro Abs: 4233 cells/uL (ref 1500–7800)
Neutrophils Relative %: 69.4 %
Platelets: 257 10*3/uL (ref 140–400)
RBC: 4.65 10*6/uL (ref 4.20–5.80)
RDW: 12.6 % (ref 11.0–15.0)
Total Lymphocyte: 18 %
WBC: 6.1 10*3/uL (ref 3.8–10.8)

## 2022-05-02 LAB — LIPASE: Lipase: 15 U/L (ref 7–60)

## 2022-07-23 ENCOUNTER — Telehealth: Payer: Self-pay | Admitting: Physician Assistant

## 2022-07-23 NOTE — Telephone Encounter (Signed)
Spoke with patient he stated he lives in [place] and is homeless

## 2022-08-10 ENCOUNTER — Encounter: Payer: Self-pay | Admitting: *Deleted

## 2022-10-29 ENCOUNTER — Encounter: Payer: Self-pay | Admitting: *Deleted

## 2023-01-27 ENCOUNTER — Telehealth: Payer: Self-pay | Admitting: Physician Assistant

## 2023-01-27 NOTE — Telephone Encounter (Signed)
Contacted Jesse Cortez to schedule their annual wellness visit. Patient declined to schedule AWV at this time.  [place]. Do not call for AWV.  [facility] Direct Dial [phone]

## 2023-03-24 ENCOUNTER — Telehealth: Payer: Self-pay | Admitting: Physician Assistant

## 2023-03-24 NOTE — Telephone Encounter (Signed)
Copied from CRM [id number]. Topic: Medicare AWV >> Mar 24, 2023  8:52 AM Gwenith Spitz wrote: Reason for CRM: Called patient to schedule Medicare Annual Wellness Visit (AWV). Left message for patient to call back and schedule Medicare Annual Wellness Visit (AWV).  Last date of AWV: 01/30/2021  Please schedule an appointment at any time with Inetta Fermo, [facility]. Please schedule AWVS with Inetta Fermo, [facility].  If any questions, please contact me at [phone].  Thank you ,  Gabriel Cirri [facility] AWV TEAM Direct Dial [phone]
